# Patient Record
Sex: Male | Born: 1955
Health system: Southern US, Community
[De-identification: ages and names within clinical notes are randomized; demographics above are authoritative.]

## PROBLEM LIST (undated history)

## (undated) DIAGNOSIS — J45909 Unspecified asthma, uncomplicated: Secondary | ICD-10-CM

## (undated) DIAGNOSIS — M1990 Unspecified osteoarthritis, unspecified site: Secondary | ICD-10-CM

## (undated) DIAGNOSIS — K219 Gastro-esophageal reflux disease without esophagitis: Secondary | ICD-10-CM

## (undated) HISTORY — DX: Unspecified osteoarthritis, unspecified site: M19.90

---

## 2014-06-24 DIAGNOSIS — N529 Male erectile dysfunction, unspecified: Secondary | ICD-10-CM | POA: Insufficient documentation

## 2014-11-24 DIAGNOSIS — J4520 Mild intermittent asthma, uncomplicated: Secondary | ICD-10-CM | POA: Insufficient documentation

## 2015-01-22 HISTORY — PX: COLONOSCOPY: SHX174

## 2015-02-09 ENCOUNTER — Ambulatory Visit
Admit: 2015-02-09 | Disposition: A | Discharge status: Home / Self Care | Payer: Self-pay | Attending: Gastroenterology | Admitting: Gastroenterology

## 2015-02-09 LAB — HM COLONOSCOPY

## 2015-02-15 LAB — SURGICAL PATHOLOGY

## 2015-03-02 DIAGNOSIS — D126 Benign neoplasm of colon, unspecified: Secondary | ICD-10-CM | POA: Insufficient documentation

## 2016-03-17 ENCOUNTER — Encounter: Payer: Self-pay | Admitting: *Deleted

## 2016-03-17 ENCOUNTER — Ambulatory Visit
Admission: EM | Admit: 2016-03-17 | Discharge: 2016-03-17 | Disposition: A | Discharge status: Home / Self Care | Payer: BLUE CROSS/BLUE SHIELD | Attending: Internal Medicine | Admitting: Internal Medicine

## 2016-03-17 DIAGNOSIS — J45901 Unspecified asthma with (acute) exacerbation: Secondary | ICD-10-CM

## 2016-03-17 DIAGNOSIS — K2960 Other gastritis without bleeding: Secondary | ICD-10-CM | POA: Diagnosis not present

## 2016-03-17 HISTORY — DX: Unspecified asthma, uncomplicated: J45.909

## 2016-03-17 MED ORDER — ALBUTEROL SULFATE HFA 108 (90 BASE) MCG/ACT IN AERS: 1.0000 | INHALATION_SPRAY | Freq: Four times a day (QID) | RESPIRATORY_TRACT | Status: DC | PRN

## 2016-03-17 MED ORDER — ESOMEPRAZOLE MAGNESIUM 40 MG PO CPDR: 40.0000 mg | DELAYED_RELEASE_CAPSULE | Freq: Every day | ORAL | Status: DC

## 2016-03-17 MED ORDER — PREDNISONE 50 MG PO TABS: 50.0000 mg | ORAL_TABLET | Freq: Every day | ORAL | Status: AC

## 2016-03-17 MED ORDER — FLUTICASONE FUROATE-VILANTEROL 100-25 MCG/INH IN AEPB: 1.0000 | INHALATION_SPRAY | Freq: Every day | RESPIRATORY_TRACT | Status: DC

## 2016-03-17 NOTE — ED Provider Notes (Signed)
CSN: [id number]     Arrival date & time 03/17/16  1355 History   First MD Initiated Contact with Patient 03/17/16 1433     Chief Complaint  Patient presents with  . Cough   HPI  Patient is a 60 year old gentleman who presents today for med refills. He has a history of asthma, and is out of his chronic medicines. He also has some difficulty with reflux gastritis. He has been followed at [facility] primary care in [place], but they no longer take his insurance.  In the last month, he has had increased cough and wheezing. Once or twice a week, he is waking up at night coughing. Cough is nonproductive. He has no fever. No leg swelling. Feels fine otherwise. Saw a lung specialist in [place] a year ago, had pulmonary function testing and imaging, and breo was started, which was very helpful. He has been using over-the-counter Nexium to manage reflux symptoms. However, once daily Nexium at prescription strength, borrowed from his girlfriend, has been more helpful.   Past Medical History  Diagnosis Date  . Asthma    Past surgical history. Had 4 colon polyps removed a year ago at [facility] History reviewed. No pertinent family history. Social History  Substance Use Topics  . Smoking status: Former Research scientist (life sciences)  . Smokeless tobacco: None  . Alcohol Use: No    Review of Systems  All other systems reviewed and are negative.   Allergies  Review of patient's allergies indicates no known allergies.  Home Medications  Breo inhaler nexium otc Chondroitin nubiotic    BP 124/80 mmHg  Pulse 63  Temp(Src) 98.1 F (36.7 C) (Oral)  Resp 16  Ht 5\' 11"  (1.803 m)  Wt 240 lb (108.863 kg)  BMI 33.49 kg/m2  SpO2 99% Physical Exam  Constitutional: He is oriented to person, place, and time. No distress.  Alert, nicely groomed  HENT:  Head: Atraumatic.  Eyes:  Conjugate gaze, no eye redness/drainage  Neck: Neck supple.  Cardiovascular: Normal rate and regular rhythm.   Pulmonary/Chest: No  respiratory distress. He has no wheezes. He has no rales.  Lungs clear, but markedly diminished posteriorly, symmetric breath sounds Barrel chested, looks slightly breathless at rest, slightly splinting  Abdominal: He exhibits no distension.  Musculoskeletal: Normal range of motion. He exhibits no edema.  Neurological: He is alert and oriented to person, place, and time.  Skin: Skin is warm and dry.  No cyanosis  Nursing note and vitals reviewed.   ED Course  Procedures (including critical care time)  None today   MDM   1. Acute asthma exacerbation, unspecified asthma severity   2. Reflux gastritis    Meds ordered this encounter  Medications  . esomeprazole (NEXIUM) 40 MG capsule    Sig: Take 1 capsule (40 mg total) by mouth daily.    Dispense:  30 capsule    Refill:  0  . albuterol (PROVENTIL HFA;VENTOLIN HFA) 108 (90 Base) MCG/ACT inhaler    Sig: Inhale 1-2 puffs into the lungs every 6 (six) hours as needed for wheezing or shortness of breath.    Dispense:  1 Inhaler    Refill:  0  . predniSONE (DELTASONE) 50 MG tablet    Sig: Take 1 tablet (50 mg total) by mouth daily.    Dispense:  5 tablet    Refill:  0  . fluticasone furoate-vilanterol (BREO ELLIPTA) 100-25 MCG/INH AEPB    Sig: Inhale 1 puff into the lungs daily.  Dispense:  1 each    Refill:  0   Referred to Dr. Vicente Masson to establish care; recheck as needed    Sherlene Shams, MD 03/17/16 [phone]

## 2016-03-17 NOTE — Discharge Instructions (Signed)
Asthma, Adult Asthma is a condition of the lungs in which the airways tighten and narrow. Asthma can make it hard to breathe. Asthma cannot be cured, but medicine and lifestyle changes can help control it. Asthma may be started (triggered) by:  Animal skin flakes (dander).  Dust.  Cockroaches.  Pollen.  Mold.  Smoke.  Cleaning products.  Hair sprays or aerosol sprays.  Paint fumes or strong smells.  Cold air, weather changes, and winds.  Crying or laughing hard.  Stress.  Certain medicines or drugs.  Foods, such as dried fruit, potato chips, and sparkling grape juice.  Infections or conditions (colds, flu).  Exercise.  Certain medical conditions or diseases.  Exercise or tiring activities. HOME CARE   Take medicine as told by your doctor.  Use a peak flow meter as told by your doctor. A peak flow meter is a tool that measures how well the lungs are working.  Record and keep track of the peak flow meter's readings.  Understand and use the asthma action plan. An asthma action plan is a written plan for taking care of your asthma and treating your attacks.  To help prevent asthma attacks:  Do not smoke. Stay away from secondhand smoke.  Change your heating and air conditioning filter often.  Limit your use of fireplaces and wood stoves.  Get rid of pests (such as roaches and mice) and their droppings.  Throw away plants if you see mold on them.  Clean your floors. Dust regularly. Use cleaning products that do not smell.  Have someone vacuum when you are not home. Use a vacuum cleaner with a HEPA filter if possible.  Replace carpet with wood, tile, or vinyl flooring. Carpet can trap animal skin flakes and dust.  Use allergy-proof pillows, mattress covers, and box spring covers.  Wash bed sheets and blankets every week in hot water and dry them in a dryer.  Use blankets that are made of polyester or cotton.  Clean bathrooms and kitchens with bleach.  If possible, have someone repaint the walls in these rooms with mold-resistant paint. Keep out of the rooms that are being cleaned and painted.  Wash hands often. GET HELP IF:  You have make a whistling sound when breaking (wheeze), have shortness of breath, or have a cough even if taking medicine to prevent attacks.  The colored mucus you cough up (sputum) is thicker than usual.  The colored mucus you cough up changes from clear or white to yellow, green, gray, or bloody.  You have problems from the medicine you are taking such as:  A rash.  Itching.  Swelling.  Trouble breathing.  You need reliever medicines more than 2-3 times a week.  Your peak flow measurement is still at 50-79% of your personal best after following the action plan for 1 hour.  You have a fever. GET HELP RIGHT AWAY IF:   You seem to be worse and are not responding to medicine during an asthma attack.  You are short of breath even at rest.  You get short of breath when doing very little activity.  You have trouble eating, drinking, or talking.  You have chest pain.  You have a fast heartbeat.  Your lips or fingernails start to turn blue.  You are light-headed, dizzy, or faint.  Your peak flow is less than 50% of your personal best.   This information is not intended to replace advice given to you by your health care provider. Make sure   you discuss any questions you have with your health care provider.   Document Released: 03/27/2008 Document Revised: 06/30/2015 Document Reviewed: 05/08/2013 Elsevier Interactive Patient Education 2016 Elsevier Inc.  

## 2016-03-17 NOTE — ED Notes (Signed)
Non-productive cough and wheezing x3 months. Rx for [facility] has expired.

## 2016-03-26 ENCOUNTER — Encounter: Payer: Self-pay | Admitting: Internal Medicine

## 2016-04-03 ENCOUNTER — Ambulatory Visit (INDEPENDENT_AMBULATORY_CARE_PROVIDER_SITE_OTHER): Payer: BLUE CROSS/BLUE SHIELD | Admitting: Internal Medicine

## 2016-04-03 ENCOUNTER — Encounter: Payer: Self-pay | Admitting: Internal Medicine

## 2016-04-03 VITALS — BP 122/82 | HR 64 | Resp 16 | Ht 72.0 in | Wt 258.6 lb

## 2016-04-03 DIAGNOSIS — N529 Male erectile dysfunction, unspecified: Secondary | ICD-10-CM

## 2016-04-03 DIAGNOSIS — K219 Gastro-esophageal reflux disease without esophagitis: Secondary | ICD-10-CM | POA: Diagnosis not present

## 2016-04-03 DIAGNOSIS — J4520 Mild intermittent asthma, uncomplicated: Secondary | ICD-10-CM | POA: Diagnosis not present

## 2016-04-03 DIAGNOSIS — N528 Other male erectile dysfunction: Secondary | ICD-10-CM | POA: Diagnosis not present

## 2016-04-03 MED ORDER — FLUTICASONE FUROATE-VILANTEROL 100-25 MCG/INH IN AEPB: 1.0000 | INHALATION_SPRAY | Freq: Every day | RESPIRATORY_TRACT | Status: DC

## 2016-04-03 MED ORDER — SILDENAFIL CITRATE 20 MG PO TABS: 60.0000 mg | ORAL_TABLET | Freq: Every day | ORAL | Status: DC | PRN

## 2016-04-03 MED ORDER — ESOMEPRAZOLE MAGNESIUM 40 MG PO CPDR: 40.0000 mg | DELAYED_RELEASE_CAPSULE | Freq: Every day | ORAL | Status: DC

## 2016-04-03 NOTE — Progress Notes (Signed)
Date:  04/03/2016   Name:  Derek Grant   DOB:  Oct 14, 1956   MRN:  [id number]   Chief Complaint: Establish Care and Asthma Asthma He complains of wheezing. There is no cough, shortness of breath or sputum production. This is a chronic (onset last year - diagnosed by Pulmonologist in [place]) problem. The problem occurs intermittently. The problem has been rapidly improving. Associated symptoms include heartburn. Pertinent negatives include no fever or headaches. His past medical history is significant for asthma.  Gastroesophageal Reflux He complains of heartburn and wheezing. He reports no abdominal pain or no coughing. This is a recurrent problem. The current episode started more than 1 year ago. The problem occurs rarely (since starting nexium). Pertinent negatives include no fatigue.  Erectile Dysfunction This is a recurrent problem. The current episode started more than 1 year ago. The problem is unchanged. The nature of his difficulty is achieving erection and maintaining erection. Pertinent negatives include no chills or dysuria. Past treatments include sildenafil. The treatment provided significant relief.  He uses 20 - 60 mg as needed.  I explained to him that he will pay cash for this prescription - it can not go through a prior approval process.   Review of Systems  Constitutional: Negative for fever, chills and fatigue.  HENT: Negative for congestion.   Eyes: Negative for visual disturbance.  Respiratory: Positive for wheezing. Negative for cough, sputum production, chest tightness and shortness of breath.   Gastrointestinal: Positive for heartburn. Negative for abdominal pain, constipation and blood in stool.  Genitourinary: Negative for dysuria.  Musculoskeletal: Negative for back pain and arthralgias.  Neurological: Negative for dizziness and headaches.  Hematological: Negative for adenopathy.  Psychiatric/Behavioral: Negative for sleep disturbance and dysphoric mood.     Patient Active Problem List   Diagnosis Date Noted  . Adenomatous colon polyp 03/02/2015  . Asthma, mild intermittent 11/24/2014  . ED (erectile dysfunction) of organic origin 06/24/2014    Prior to Admission medications   Medication Sig Start Date End Date Taking? Authorizing Provider  albuterol (PROVENTIL HFA;VENTOLIN HFA) 108 (90 Base) MCG/ACT inhaler Inhale 1-2 puffs into the lungs every 6 (six) hours as needed for wheezing or shortness of breath. 03/17/16  Yes Sherlene Shams, MD  esomeprazole (NEXIUM) 40 MG capsule Take 1 capsule (40 mg total) by mouth daily. 03/17/16  Yes Sherlene Shams, MD  fluticasone furoate-vilanterol (BREO ELLIPTA) 100-25 MCG/INH AEPB Inhale 1 puff into the lungs daily. 03/17/16  Yes Sherlene Shams, MD  Glucosamine HCl (GLUCOSAMINE PO) Take by mouth.   Yes Historical Provider, MD  Probiotic Product (PROBIOTIC ADVANCED PO) Take by mouth.   Yes Historical Provider, MD  Specialty Vitamins Products (PROSTATE PO) Take by mouth.   Yes Historical Provider, MD    No Known Allergies  Past Surgical History  Procedure Laterality Date  . Colonoscopy  01/2015    adenomatous polyps    Social History  Substance Use Topics  . Smoking status: Former Smoker    Types: Cigarettes    Quit date: 06/25/1999  . Smokeless tobacco: None  . Alcohol Use: No     Medication list has been reviewed and updated.   Physical Exam  Constitutional: He is oriented to person, place, and time. He appears well-developed. No distress.  HENT:  Head: Normocephalic and atraumatic.  Neck: Normal range of motion. Neck supple. Carotid bruit is not present. No thyromegaly present.  Cardiovascular: Normal rate, regular rhythm and normal heart sounds.  Pulmonary/Chest: Effort normal and breath sounds normal. No respiratory distress. He has no wheezes. He has no rales.  Musculoskeletal: Normal range of motion.  Lymphadenopathy:    He has no cervical adenopathy.  Neurological: He is alert  and oriented to person, place, and time.  Skin: Skin is warm and dry. No rash noted.  Psychiatric: He has a normal mood and affect. His speech is normal and behavior is normal. Thought content normal.  Nursing note and vitals reviewed.   BP 122/82 mmHg  Pulse 64  Resp 16  Ht 6' (1.829 m)  Wt 258 lb 9.6 oz (117.3 kg)  BMI 35.06 kg/m2  SpO2 98%  Assessment and Plan: 1. Asthma, mild intermittent, uncomplicated - fluticasone furoate-vilanterol (BREO ELLIPTA) 100-25 MCG/INH AEPB; Inhale 1 puff into the lungs daily.  Dispense: 1 each; Refill: 12  2. Gastroesophageal reflux disease, esophagitis presence not specified - esomeprazole (NEXIUM) 40 MG capsule; Take 1 capsule (40 mg total) by mouth daily.  Dispense: 30 capsule; Refill: 12  3. ED (erectile dysfunction) of organic origin - sildenafil (REVATIO) 20 MG tablet; Take 3 tablets (60 mg total) by mouth daily as needed.  Dispense: 50 tablet; Refill: 0   Halina Maidens, MD [facility]  04/03/2016

## 2016-06-09 ENCOUNTER — Encounter: Payer: Self-pay | Admitting: Emergency Medicine

## 2016-06-09 ENCOUNTER — Emergency Department: Payer: BLUE CROSS/BLUE SHIELD

## 2016-06-09 ENCOUNTER — Ambulatory Visit (INDEPENDENT_AMBULATORY_CARE_PROVIDER_SITE_OTHER)
Admission: EM | Admit: 2016-06-09 | Discharge: 2016-06-09 | Disposition: A | Discharge status: Other Acute Inpatient Hospital | Payer: BLUE CROSS/BLUE SHIELD | Source: Home / Self Care

## 2016-06-09 ENCOUNTER — Encounter: Payer: Self-pay | Admitting: *Deleted

## 2016-06-09 ENCOUNTER — Emergency Department
Admission: EM | Admit: 2016-06-09 | Discharge: 2016-06-09 | Disposition: A | Discharge status: Home / Self Care | Payer: BLUE CROSS/BLUE SHIELD | Attending: Emergency Medicine | Admitting: Emergency Medicine

## 2016-06-09 DIAGNOSIS — S93401A Sprain of unspecified ligament of right ankle, initial encounter: Secondary | ICD-10-CM | POA: Diagnosis not present

## 2016-06-09 DIAGNOSIS — J4520 Mild intermittent asthma, uncomplicated: Secondary | ICD-10-CM | POA: Insufficient documentation

## 2016-06-09 DIAGNOSIS — Y939 Activity, unspecified: Secondary | ICD-10-CM | POA: Insufficient documentation

## 2016-06-09 DIAGNOSIS — M79646 Pain in unspecified finger(s): Secondary | ICD-10-CM

## 2016-06-09 DIAGNOSIS — W01198A Fall on same level from slipping, tripping and stumbling with subsequent striking against other object, initial encounter: Secondary | ICD-10-CM | POA: Insufficient documentation

## 2016-06-09 DIAGNOSIS — M79645 Pain in left finger(s): Secondary | ICD-10-CM | POA: Diagnosis not present

## 2016-06-09 DIAGNOSIS — Y929 Unspecified place or not applicable: Secondary | ICD-10-CM | POA: Diagnosis not present

## 2016-06-09 DIAGNOSIS — S01511A Laceration without foreign body of lip, initial encounter: Secondary | ICD-10-CM | POA: Insufficient documentation

## 2016-06-09 DIAGNOSIS — Z23 Encounter for immunization: Secondary | ICD-10-CM | POA: Diagnosis not present

## 2016-06-09 DIAGNOSIS — Z87891 Personal history of nicotine dependence: Secondary | ICD-10-CM | POA: Diagnosis not present

## 2016-06-09 DIAGNOSIS — Y999 Unspecified external cause status: Secondary | ICD-10-CM | POA: Insufficient documentation

## 2016-06-09 DIAGNOSIS — Z79899 Other long term (current) drug therapy: Secondary | ICD-10-CM | POA: Insufficient documentation

## 2016-06-09 MED ORDER — TETANUS-DIPHTH-ACELL PERTUSSIS 5-2.5-18.5 LF-MCG/0.5 IM SUSP
0.5000 mL | Freq: Once | INTRAMUSCULAR | Status: AC
  Administered 2016-06-09: 0.5 mL via INTRAMUSCULAR
  Filled 2016-06-09: qty 0.5

## 2016-06-09 MED ORDER — LIDOCAINE-EPINEPHRINE (PF) 2 %-1:200000 IJ SOLN
10.0000 mL | Freq: Once | INTRAMUSCULAR | Status: AC
  Administered 2016-06-09: 10 mL
  Filled 2016-06-09: qty 10

## 2016-06-09 MED ORDER — CEPHALEXIN 500 MG PO CAPS: 500.0000 mg | ORAL_CAPSULE | Freq: Four times a day (QID) | ORAL | 0 refills | Status: AC

## 2016-06-09 MED ORDER — LIDOCAINE-EPINEPHRINE (PF) 1 %-1:200000 IJ SOLN
INTRAMUSCULAR | Status: AC
  Filled 2016-06-09: qty 30

## 2016-06-09 NOTE — ED Notes (Signed)
Pt verbalized understanding of discharge instructions. NAD at this time. 

## 2016-06-09 NOTE — ED Provider Notes (Signed)
[facility] ____________________________________________  Time seen: Approximately 12:32 PM  I have reviewed the triage vital signs and the nursing notes.   HISTORY  Chief Complaint Laceration   HPI Derek Grant is a 60 y.o. male presents for evaluation of facial laceration. Patient reports that at 10:30 this morning he was coming out of a [employer]. Patient reports the recently repeat the sidewalk, and states that he did not realize the curb was so close and states that his right ankle rolled over the curb as he stepped on the curb edge. Patient reports rolling right ankle cough and then fall forward. Patient reports he did catch himself with his hands but his hand slid out from under him causing him then to hit his upper lip on the concrete. Patient states he does not feel like he hit any other place on his head or his face. Denies loss of consciousness, dizziness, or dazed sensation. Patient reports that he initially went to [facility] urgent care provide dilation of laceration of lip was then referred to the ER.  Patient also reports mild right ankle and left fifth finger pain from the injury. Patient denies any headache, nausea, vomiting, dizziness, vision changes, weakness, chest pain, shortness of breath, neck pain, back pain, other extremity pain or injury. Patient does not feel that he hit his head other than hitting his lip. Patient reports he does take a baby aspirin occasionally but denies any other anticoagulation. Patient reports he fell only because of rolling his ankle on the curb. Unsure of last tetanus immunization.  PCP: Army Melia   Past Medical History:  Diagnosis Date  . Asthma     Patient Active Problem List   Diagnosis Date Noted  . GERD (gastroesophageal reflux disease) 04/03/2016  . Esophageal reflux 04/03/2016  . Adenomatous colon polyp 03/02/2015  . Asthma, mild intermittent 11/24/2014  . ED (erectile dysfunction) of organic origin 06/24/2014     Past Surgical History:  Procedure Laterality Date  . COLONOSCOPY  01/2015   adenomatous polyps    Current Facility-Administered Medications:  .  lidocaine-EPINEPHrine (XYLOCAINE-EPINEPHrine) 1 %-1:200000 (PF) injection, , , ,   Current Outpatient Prescriptions:  .  albuterol (PROVENTIL HFA;VENTOLIN HFA) 108 (90 Base) MCG/ACT inhaler, Inhale 1-2 puffs into the lungs every 6 (six) hours as needed for wheezing or shortness of breath., Disp: 1 Inhaler, Rfl: 0 .  cephALEXin (KEFLEX) 500 MG capsule, Take 1 capsule (500 mg total) by mouth 4 (four) times daily., Disp: 28 capsule, Rfl: 0 .  esomeprazole (NEXIUM) 40 MG capsule, Take 1 capsule (40 mg total) by mouth daily., Disp: 30 capsule, Rfl: 12 .  fluticasone furoate-vilanterol (BREO ELLIPTA) 100-25 MCG/INH AEPB, Inhale 1 puff into the lungs daily., Disp: 1 each, Rfl: 12 .  Glucosamine HCl (GLUCOSAMINE PO), Take by mouth., Disp: , Rfl:  .  Probiotic Product (PROBIOTIC ADVANCED PO), Take by mouth., Disp: , Rfl:  .  sildenafil (REVATIO) 20 MG tablet, Take 3 tablets (60 mg total) by mouth daily as needed., Disp: 50 tablet, Rfl: 0 .  Specialty Vitamins Products (PROSTATE PO), Take by mouth., Disp: , Rfl:   Allergies Review of patient's allergies indicates no known allergies.  Family History  Problem Relation Age of Onset  . Diabetes Mother   . Alzheimer's disease Father     Social History Social History  Substance Use Topics  . Smoking status: Former Smoker    Types: Cigarettes    Quit date: 06/25/1999  . Smokeless tobacco: Never Used  .  Alcohol use No    Review of Systems Constitutional: No fever/chills Eyes: No visual changes. ENT: No sore throat. Cardiovascular: Denies chest pain. Respiratory: Denies shortness of breath. Gastrointestinal: No abdominal pain.  No nausea, no vomiting.  No diarrhea.  No constipation. Genitourinary: Negative for dysuria. Musculoskeletal: Negative for back pain. Skin: Negative for rash. As  above. Neurological: Negative for headaches, focal weakness or numbness.  10-point ROS otherwise negative.  ____________________________________________   PHYSICAL EXAM:  VITAL SIGNS: ED Triage Vitals  Enc Vitals Group     BP 06/09/16 1215 138/68     Pulse Rate 06/09/16 1215 (!) 57 Recheck 68     Resp 06/09/16 1215 18     Temp --      Temp src --      SpO2 06/09/16 1215 97 %     Weight 06/09/16 1216 240 lb (108.9 kg)     Height 06/09/16 1216 5\' 11"  (1.803 m)     Head Circumference --      Peak Flow --      Pain Score 06/09/16 1216 3     Pain Loc --      Pain Edu? --      Excl. in [place]? --     Constitutional: Alert and oriented. Well appearing and in no acute distress. Eyes: Conjunctivae are normal. PERRL. EOMI. ENT      Head: Normocephalic and atraumatic.No facial erythema, ecchymosis, tenderness. Except see skin below.      Nose: No congestion/rhinnorhea. No nasal tenderness. Nares patent.      Mouth/Throat: Mucous membranes are moist.Oropharynx non-erythematous.No dental injury noted. No dental tenderness. No oral tenderness.  Neck: No stridor. Supple without meningismus.  Hematological/Lymphatic/Immunilogical: No cervical lymphadenopathy. Cardiovascular: Normal rate, regular rhythm. Grossly normal heart sounds.  Good peripheral circulation. Respiratory: Normal respiratory effort without tachypnea nor retractions. Breath sounds are clear and equal bilaterally. No wheezes/rales/rhonchi. Chest nontender to palpation. Gastrointestinal: Soft and nontender. No distention.  Musculoskeletal:  Nontender with normal range of motion in all extremities. No midline cervical, thoracic or lumbar tenderness to palpation. Bilateral pedal pulses equal and easily palpated. Except: Right lateral malleolus mild tenderness palpation, minimal swelling, full range motion, mild pain with ankle rotation, no pain with plantar flexion or dorsiflexion, normal distal capillary refill, normal  sensation. Except: Left fifth digit proximal phalanx and MCP mild tenderness to palpation with minimal swelling, full range of motion, no pain with flexion or extension, normal this capillary refill, able to make a full fist with left hand without pain. Skin intact. Neurologic:  Normal speech and language. No gross focal neurologic deficits are appreciated. Speech is normal. No gait instability. GCS 15. Skin:  Skin is warm, dry and intact. No rash noted. Except: Upper lip just right of midline approximately 1 cm beneath the vermilion border, through and through laceration present, outer laceration 2.5 cm, inner laceration 3 cm with jagged edges. Mild active bleeding, mild lip swelling, no foreign bodies visualized. Psychiatric: Mood and affect are normal. Speech and behavior are normal. Patient exhibits appropriate insight and judgment   ___________________________________________   LABS (all labs ordered are listed, but only abnormal results are displayed)  Labs Reviewed - No data to display ____________________________________________  RADIOLOGY  Dg Ankle Complete Right  Result Date: 06/09/2016 CLINICAL DATA:  Pain following fall EXAM: RIGHT ANKLE - COMPLETE 3+ VIEW COMPARISON:  None. FINDINGS: Frontal, oblique, and lateral views were obtained. There is well corticated calcification in the medial and lateral malleolar regions, likely  residua of prior trauma. A calcification anterior to the mid talus appears well corticated and likely represents an old avulsion injury as well. No acute appearing fracture is evident. There is no joint effusion. The ankle mortise appears intact. There is mild narrowing of the medial aspect of the joint. There is a spur arising from the posterior calcaneus. IMPRESSION: No acute appearing fracture. Ankle mortise appears intact. Evidence of prior avulsion type injuries at several sites. Osteoarthritic changes noted medially. There is a small posterior calcaneal spur.  Electronically Signed   By: Lowella Grip III M.D.   On: 06/09/2016 13:18   Dg Finger Little Left  Result Date: 06/09/2016 CLINICAL DATA:  Pain following fall EXAM: LEFT FIFTH FINGER 2+V COMPARISON:  None. FINDINGS: Frontal, oblique, and lateral views were obtained. There is soft tissue swelling, primarily in the PIP joint region. There is no demonstrable fracture or dislocation. Joint spaces appear unremarkable. IMPRESSION: Soft tissue swelling. No demonstrable fracture or dislocation. No apparent arthropathy. Electronically Signed   By: Lowella Grip III M.D.   On: 06/09/2016 13:17   ____________________________________________   PROCEDURES Procedures   Procedure(s) performed:  Procedure explained and verbal consent obtained. Consent: Verbal consent obtained. Written consent not obtained. Risks and benefits: risks, benefits and alternatives were discussed Patient identity confirmed: verbally with patient and hospital-assigned identification number  Consent given by: patient   Laceration Repair Location: lip Length: external: 2.5 cm inner: 3 cm Foreign bodies: no foreign bodies Tendon involvement: none Nerve involvement: none Preparation: Patient was prepped and draped in the usual sterile fashion. Anesthesia with 1% Lidocaine with epi 7 mls Irrigation solution: saline and betadine Irrigation method: jet lavage Amount of cleaning: copious Repaired with 6-0 vicryl  Number of sutures: external: 5 inner: 6 Technique: simple interrupted  Approximation: loose Patient tolerate well. Wound well approximated post repair.  Antibiotic ointment and dressing applied.  Wound care instructions provided.  Observe for any signs of infection or other problems.    _________________________________________   INITIAL IMPRESSION / ASSESSMENT AND PLAN / ED COURSE  Pertinent labs & imaging results that were available during my care of the patient were reviewed by me and considered in my  medical decision making (see chart for details).  Very well-appearing patient. Presenting for evaluation of lip laceration post mechanical fall. Patient reports he fell when he rolled his ankle and fell forwards. Patient reports catching himself with his hands but still hit his lip on the concrete. Denies hitting his head anywhere else. No focal neurological deficits. Denies headache. Well appearing. Mild right ankle and left fifth digit pain, will evaluate x-rays. Discussed with patient, as patient with no facial tenderness, head tenderness, headache, loss consciousness or days, patient declines evaluation of CT head or face.  Per radiologist right ankle no acute changes, left fifth digit soft tissue swelling without bony abnormality. Laceration repaired. Patient tolerated well. Patient declines any need for splinting or assisted devices. Patient reports that he feels well. Counseled regarding close monitoring and follow-up. Counseled regarding eating soft foods as well as frequent mouth irrigation and rinsing. Will place patient on oral cephalexin. Tetanus immunization updated. Encourage patient to have one follow-up in 2-3 days.  Discussed follow up with Primary care physician this week. Discussed follow up and return parameters including no resolution or any worsening concerns. Patient verbalized understanding and agreed to plan.   ____________________________________________   FINAL CLINICAL IMPRESSION(S) / ED DIAGNOSES  Final diagnoses:  Finger pain  Lip laceration, initial encounter  Ankle sprain, right, initial encounter     Discharge Medication List as of 06/09/2016  1:53 PM    START taking these medications   Details  cephALEXin (KEFLEX) 500 MG capsule Take 1 capsule (500 mg total) by mouth 4 (four) times daily., Starting Fri 06/09/2016, Until Fri 06/16/2016, Print        Note: This dictation was prepared with Dragon dictation along with smaller phrase technology. Any  transcriptional errors that result from this process are unintentional.    Clinical Course      Marylene Land, NP 06/09/16 [place], MD 06/09/16 [phone]

## 2016-06-09 NOTE — Discharge Instructions (Signed)
Take medication as prescribed. Rest. Drink plenty of fluids. Apply ice. Keep clean. Rinse mouth out frequently as discussed. Eat soft foods for 2-3 days.   Wound check as discussed in 2-3 days.   Follow up with your primary care physician this week as needed. Return to ER  for new or worsening concerns.

## 2016-06-09 NOTE — ED Triage Notes (Signed)
Pt presents to ED with laceration to top lip. Pt tripped and landed on concrete. Denies LOC. Denies dizziness prior to fall.

## 2016-06-09 NOTE — ED Provider Notes (Signed)
Patient presents after catching foot on uneven concrete sidewalk at [facility].  [place] [address] forward and has large internal and external upper lip laceration.  No loss of consciousness.  Teeth feel like they are in the right place to patient.  Referred to ED for repair.  Will drive self to [facility] ED.  Charge nurse notified.     Sherlene Shams, MD 06/09/16 [phone]

## 2016-06-09 NOTE — ED Triage Notes (Signed)
Patient fell on concrete today leaving a restaurant, lacerating his upper lip. Laceration is located on the outside of the upper lip and a separate laceration is located interior of upper lip.

## 2016-06-12 ENCOUNTER — Ambulatory Visit
Admission: EM | Admit: 2016-06-12 | Discharge: 2016-06-12 | Disposition: A | Discharge status: Home / Self Care | Payer: BLUE CROSS/BLUE SHIELD | Attending: Family Medicine | Admitting: Family Medicine

## 2016-06-12 ENCOUNTER — Encounter: Payer: Self-pay | Admitting: *Deleted

## 2016-06-12 DIAGNOSIS — Z4801 Encounter for change or removal of surgical wound dressing: Secondary | ICD-10-CM

## 2016-06-12 DIAGNOSIS — Z5189 Encounter for other specified aftercare: Secondary | ICD-10-CM

## 2016-06-12 NOTE — Discharge Instructions (Signed)
Continue medication as prescribed. Rest. Drink plenty of fluids. Continue rinsing and keeping clean.   Follow up with your primary care physician this week as needed. Return to Urgent care or ER for new or worsening concerns.

## 2016-06-12 NOTE — ED Triage Notes (Signed)
Patient is here for a suture check of lip laceration that occurred 3 days ago.

## 2016-06-12 NOTE — ED Provider Notes (Signed)
[facility] ____________________________________________  Time seen: Approximately 2:55 PM  I have reviewed the triage vital signs and the nursing notes.   HISTORY  Chief Complaint Lip Laceration  HPI Derek Grant is a 60 y.o. male presenting today for wound check to left laceration that occurred 3 days ago. Patient reports 3 days ago he fell after rolling his ankle on a curb causing him to fall forward and hit his lip on the concrete. Denies head injury otherwise, denies loss of consciousness. Patient reports he was seen in the ER and had laceration repaired. Patient reports he is taking the oral cephalexin as prescribed as well as frequently rinsing his mouth with water. Patient reports the laceration seems to be healing well. Patient reports swelling in his lip has improved but still slightly swollen. States lip was painful the same day of injury, but reports no pain now. Denies any drainage, redness, pain currently, other swelling. Reports right ankle and left fifth finger pain that was present when in the ER has improved, denies current pain. Reports he had a mild headache Saturday, that resolved. Denies current headache. Denies dizziness, vision changes, neck pain, back pain or other complaints. Patient reports he is feeling well otherwise. Reports tetanus immunization updated in ER. Reports has continued to work over the weekend.     Past Medical History:  Diagnosis Date  . Asthma     Patient Active Problem List   Diagnosis Date Noted  . GERD (gastroesophageal reflux disease) 04/03/2016  . Esophageal reflux 04/03/2016  . Adenomatous colon polyp 03/02/2015  . Asthma, mild intermittent 11/24/2014  . ED (erectile dysfunction) of organic origin 06/24/2014    Past Surgical History:  Procedure Laterality Date  . COLONOSCOPY  01/2015   adenomatous polyps    No current facility-administered medications for this encounter.   Current Outpatient Prescriptions:  .   albuterol (PROVENTIL HFA;VENTOLIN HFA) 108 (90 Base) MCG/ACT inhaler, Inhale 1-2 puffs into the lungs every 6 (six) hours as needed for wheezing or shortness of breath., Disp: 1 Inhaler, Rfl: 0 .  cephALEXin (KEFLEX) 500 MG capsule, Take 1 capsule (500 mg total) by mouth 4 (four) times daily., Disp: 28 capsule, Rfl: 0 .  esomeprazole (NEXIUM) 40 MG capsule, Take 1 capsule (40 mg total) by mouth daily., Disp: 30 capsule, Rfl: 12 .  fluticasone furoate-vilanterol (BREO ELLIPTA) 100-25 MCG/INH AEPB, Inhale 1 puff into the lungs daily., Disp: 1 each, Rfl: 12 .  Glucosamine HCl (GLUCOSAMINE PO), Take by mouth., Disp: , Rfl:  .  Probiotic Product (PROBIOTIC ADVANCED PO), Take by mouth., Disp: , Rfl:  .  sildenafil (REVATIO) 20 MG tablet, Take 3 tablets (60 mg total) by mouth daily as needed., Disp: 50 tablet, Rfl: 0 .  Specialty Vitamins Products (PROSTATE PO), Take by mouth., Disp: , Rfl:   Allergies Review of patient's allergies indicates no known allergies.  Family History  Problem Relation Age of Onset  . Diabetes Mother   . Alzheimer's disease Father     Social History Social History  Substance Use Topics  . Smoking status: Former Smoker    Types: Cigarettes    Quit date: 06/25/1999  . Smokeless tobacco: Never Used  . Alcohol use No    Review of Systems Constitutional: No fever/chills Eyes: No visual changes. ENT: No sore throat. Cardiovascular: Denies chest pain. Respiratory: Denies shortness of breath. Gastrointestinal: No abdominal pain.  No nausea, no vomiting.  No diarrhea.  No constipation. Genitourinary: Negative for dysuria. Musculoskeletal: Negative  for back pain. Skin: Negative for rash. Neurological: Negative for focal weakness or numbness.  10-point ROS otherwise negative.  ____________________________________________   PHYSICAL EXAM:  VITAL SIGNS: ED Triage Vitals  Enc Vitals Group     BP 06/12/16 1354 117/79     Pulse Rate 06/12/16 1354 75     Resp  06/12/16 1354 18     Temp 06/12/16 1354 (!) 96.7 F (35.9 C)     Temp Source 06/12/16 1354 Tympanic     SpO2 06/12/16 1354 98 %     Weight --      Height --      Head Circumference --      Peak Flow --      Pain Score 06/12/16 1355 0     Pain Loc --      Pain Edu? --      Excl. in [place]? --     Constitutional: Alert and oriented. Well appearing and in no acute distress. Eyes: Conjunctivae are normal. PERRL. EOMI. ENT      Head: Normocephalic and atraumatic.Nontender.       Nose: No congestion/rhinnorhea.      Mouth/Throat: Mucous membranes are moist.Oropharynx non-erythematous.No dental tenderness.  Cardiovascular: Normal rate, regular rhythm. Grossly normal heart sounds.   Respiratory: Normal respiratory effort without tachypnea nor retractions.  Musculoskeletal: Ambulatory with steady gait. No cervical tenderness to palpation.  Neurologic:  Normal speech and language. No gross focal neurologic deficits are appreciated. Speech is normal. No gait instability.  Skin:  Skin is warm, dry and intact. No rash noted. Except: upper mid to right lip mild swelling with healing laceration, 5 outer sutures, 6 inner, sutures appear to be absorbing well, no exudate or drainage, nontender, no surrounding erythema, no fluctuance, no other swelling noted. Laceration is well approximated. Psychiatric: Mood and affect are normal. Speech and behavior are normal. Patient exhibits appropriate insight and judgment   ___________________________________________   LABS (all labs ordered are listed, but only abnormal results are displayed)  Labs Reviewed - No data to display ____________________________________________  PROCEDURES Procedures   INITIAL IMPRESSION / ASSESSMENT AND PLAN / ED COURSE  Pertinent labs & imaging results that were available during my care of the patient were reviewed by me and considered in my medical decision making (see chart for details).  Very well-appearing patient. No  acute distress. Presenting for  Wound check to lip laceration sustained 3 days ago that was repaired in the ER. Tetanus immunization was updated in ER per patient. Patient reports injury occurred from mechanical fall. Denies other complaints at this time. Laceration appears to be healing well without signs of acute bacterial infection. Laceration well approximated. Encouraged patient to continue applying ice continue taking home antibiotics continue keeping clean and rinsing frequently. Encouraged patient to continue monitoring for any pain, increased swelling or drainage or redness. Discussed strict follow-up and return parameters including any change in pain, headaches, dizziness, vision changes, weakness, drainage, fevers, new or worsening concerns.   Discussed follow up with Primary care physician this week. Discussed follow up and return parameters including no resolution or any worsening concerns. Patient verbalized understanding and agreed to plan.   ____________________________________________   FINAL CLINICAL IMPRESSION(S) / ED DIAGNOSES  Final diagnoses:  Visit for wound check     Discharge Medication List as of 06/12/2016  2:07 PM      Note: This dictation was prepared with Dragon dictation along with smaller phrase technology. Any transcriptional errors that result from this process  are unintentional.    Clinical Course        Marylene Land, NP 06/12/16 1516

## 2016-06-27 ENCOUNTER — Ambulatory Visit
Admission: EM | Admit: 2016-06-27 | Discharge: 2016-06-27 | Disposition: A | Discharge status: Home / Self Care | Payer: BLUE CROSS/BLUE SHIELD | Attending: Family Medicine | Admitting: Family Medicine

## 2016-06-27 ENCOUNTER — Ambulatory Visit (INDEPENDENT_AMBULATORY_CARE_PROVIDER_SITE_OTHER)
Admit: 2016-06-27 | Discharge: 2016-06-27 | Disposition: A | Discharge status: Home / Self Care | Payer: BLUE CROSS/BLUE SHIELD | Attending: Family Medicine | Admitting: Family Medicine

## 2016-06-27 ENCOUNTER — Encounter: Payer: Self-pay | Admitting: Emergency Medicine

## 2016-06-27 DIAGNOSIS — F0781 Postconcussional syndrome: Secondary | ICD-10-CM | POA: Diagnosis not present

## 2016-06-27 DIAGNOSIS — S060X0D Concussion without loss of consciousness, subsequent encounter: Secondary | ICD-10-CM | POA: Diagnosis not present

## 2016-06-27 NOTE — ED Triage Notes (Signed)
Patient c/o HAs that started after he fell couple of weeks ago.  Patient denies N/V.

## 2016-06-27 NOTE — ED Notes (Signed)
CT scan scheduled for today at [facility] at 2:30pm

## 2016-06-27 NOTE — ED Notes (Signed)
CT Scan prior authorization # [id number]

## 2016-08-08 NOTE — ED Provider Notes (Signed)
[facility]    CSN: [id number] Arrival date & time: 06/27/16  1208     History   Chief Complaint Chief Complaint  Patient presents with  . Headache    HPI Derek Grant is a 60 y.o. male.   The history is provided by the patient.  Headache  Pain location:  Generalized Quality:  Dull Radiates to:  Does not radiate Severity currently:  4/10 Duration:  3 weeks Timing:  Intermittent Progression:  Waxing and waning Chronicity:  New Similar to prior headaches: yes   Context comment:  Fall several weeks ago hitting his head; denies loss of consciousness Relieved by:  Acetaminophen Associated symptoms: fatigue   Associated symptoms: no abdominal pain, no back pain, no blurred vision, no congestion, no cough, no diarrhea, no dizziness, no drainage, no ear pain, no eye pain, no facial pain, no fever, no focal weakness, no hearing loss, no loss of balance, no myalgias, no nausea, no near-syncope, no neck pain, no neck stiffness, no numbness, no paresthesias, no photophobia, no seizures, no sinus pressure, no swollen glands, no syncope, no tingling, no visual change, no vomiting and no weakness   Risk factors: no family hx of SAH     Past Medical History:  Diagnosis Date  . Asthma     Patient Active Problem List   Diagnosis Date Noted  . GERD (gastroesophageal reflux disease) 04/03/2016  . Esophageal reflux 04/03/2016  . Adenomatous colon polyp 03/02/2015  . Asthma, mild intermittent 11/24/2014  . ED (erectile dysfunction) of organic origin 06/24/2014    Past Surgical History:  Procedure Laterality Date  . COLONOSCOPY  01/2015   adenomatous polyps       Home Medications    Prior to Admission medications   Medication Sig Start Date End Date Taking? Authorizing Provider  albuterol (PROVENTIL HFA;VENTOLIN HFA) 108 (90 Base) MCG/ACT inhaler Inhale 1-2 puffs into the lungs every 6 (six) hours as needed for wheezing or shortness of breath. 03/17/16   Sherlene Shams, MD  esomeprazole (NEXIUM) 40 MG capsule Take 1 capsule (40 mg total) by mouth daily. 04/03/16   Glean Hess, MD  fluticasone furoate-vilanterol (BREO ELLIPTA) 100-25 MCG/INH AEPB Inhale 1 puff into the lungs daily. 04/03/16   Glean Hess, MD  Glucosamine HCl (GLUCOSAMINE PO) Take by mouth.    Historical Provider, MD  Probiotic Product (PROBIOTIC ADVANCED PO) Take by mouth.    Historical Provider, MD  sildenafil (REVATIO) 20 MG tablet Take 3 tablets (60 mg total) by mouth daily as needed. 04/03/16   Glean Hess, MD  Specialty Vitamins Products (PROSTATE PO) Take by mouth.    Historical Provider, MD    Family History Family History  Problem Relation Age of Onset  . Diabetes Mother   . Alzheimer's disease Father     Social History Social History  Substance Use Topics  . Smoking status: Former Smoker    Types: Cigarettes    Quit date: 06/25/1999  . Smokeless tobacco: Never Used  . Alcohol use No     Allergies   Review of patient's allergies indicates no known allergies.   Review of Systems Review of Systems  Constitutional: Positive for fatigue. Negative for fever.  HENT: Negative for congestion, ear pain, hearing loss, postnasal drip and sinus pressure.   Eyes: Negative for blurred vision, photophobia and pain.  Respiratory: Negative for cough.   Cardiovascular: Negative for syncope and near-syncope.  Gastrointestinal: Negative for abdominal pain, diarrhea, nausea and  vomiting.  Musculoskeletal: Negative for back pain, myalgias, neck pain and neck stiffness.  Neurological: Positive for headaches. Negative for dizziness, focal weakness, seizures, weakness, numbness, paresthesias and loss of balance.     Physical Exam Triage Vital Signs ED Triage Vitals  Enc Vitals Group     BP 06/27/16 1236 118/79     Pulse Rate 06/27/16 1236 64     Resp 06/27/16 1236 16     Temp 06/27/16 1236 98.2 F (36.8 C)     Temp Source 06/27/16 1236 Oral     SpO2 06/27/16  1236 98 %     Weight 06/27/16 1235 240 lb (108.9 kg)     Height 06/27/16 1235 5\' 11"  (1.803 m)     Head Circumference --      Peak Flow --      Pain Score 06/27/16 1235 2     Pain Loc --      Pain Edu? --      Excl. in [place]? --    No data found.   Updated Vital Signs BP 118/79 (BP Location: Left Arm)   Pulse 64   Temp 98.2 F (36.8 C) (Oral)   Resp 16   Ht 5\' 11"  (1.803 m)   Wt 240 lb (108.9 kg)   SpO2 98%   BMI 33.47 kg/m   Visual Acuity Right Eye Distance:   Left Eye Distance:   Bilateral Distance:    Right Eye Near:   Left Eye Near:    Bilateral Near:     Physical Exam  Constitutional: He is oriented to person, place, and time. He appears well-developed and well-nourished. No distress.  HENT:  Head: Normocephalic and atraumatic.  Right Ear: Tympanic membrane, external ear and ear canal normal.  Left Ear: Tympanic membrane, external ear and ear canal normal.  Nose: Nose normal.  Mouth/Throat: Uvula is midline, oropharynx is clear and moist and mucous membranes are normal. No oropharyngeal exudate or tonsillar abscesses.  Eyes: Conjunctivae and EOM are normal. Pupils are equal, round, and reactive to light. Right eye exhibits no discharge. Left eye exhibits no discharge. No scleral icterus.  Neck: Normal range of motion. Neck supple. No tracheal deviation present. No thyromegaly present.  Cardiovascular: Normal rate, regular rhythm and normal heart sounds.   Pulmonary/Chest: Effort normal and breath sounds normal. No stridor. No respiratory distress. He has no wheezes. He has no rales. He exhibits no tenderness.  Lymphadenopathy:    He has no cervical adenopathy.  Neurological: He is alert and oriented to person, place, and time. He has normal reflexes. He displays normal reflexes. No cranial nerve deficit. He exhibits normal muscle tone. Coordination normal.  Skin: Skin is warm and dry. No rash noted. He is not diaphoretic.  Nursing note and vitals  reviewed.    UC Treatments / Results  Labs (all labs ordered are listed, but only abnormal results are displayed) Labs Reviewed - No data to display  EKG  EKG Interpretation None       Radiology No results found.  Procedures Procedures (including critical care time)  Medications Ordered in UC Medications - No data to display   Initial Impression / Assessment and Plan / UC Course  I have reviewed the triage vital signs and the nursing notes.  Pertinent labs & imaging results that were available during my care of the patient were reviewed by me and considered in my medical decision making (see chart for details).  Clinical Course  Final Clinical Impressions(s) / UC Diagnoses   Final diagnoses:  Concussion, without loss of consciousness, subsequent encounter  Post concussion syndrome    New Prescriptions Discharge Medication List as of 06/27/2016  3:01 PM     1. diagnosis reviewed with patient 2. Recommend supportive treatment with otc analgesics, rest, monitor 4. Follow-up prn if symptoms worsen or don't improve   Norval Gable, MD 08/08/16 [phone]

## 2016-08-09 ENCOUNTER — Ambulatory Visit (INDEPENDENT_AMBULATORY_CARE_PROVIDER_SITE_OTHER): Payer: BLUE CROSS/BLUE SHIELD | Admitting: Internal Medicine

## 2016-08-09 ENCOUNTER — Encounter: Payer: Self-pay | Admitting: Internal Medicine

## 2016-08-09 VITALS — BP 117/78 | HR 63 | Resp 16 | Ht 71.0 in | Wt 243.0 lb

## 2016-08-09 DIAGNOSIS — Z0000 Encounter for general adult medical examination without abnormal findings: Secondary | ICD-10-CM

## 2016-08-09 DIAGNOSIS — J4520 Mild intermittent asthma, uncomplicated: Secondary | ICD-10-CM

## 2016-08-09 DIAGNOSIS — Z125 Encounter for screening for malignant neoplasm of prostate: Secondary | ICD-10-CM | POA: Diagnosis not present

## 2016-08-09 DIAGNOSIS — K219 Gastro-esophageal reflux disease without esophagitis: Secondary | ICD-10-CM | POA: Diagnosis not present

## 2016-08-09 DIAGNOSIS — Z23 Encounter for immunization: Secondary | ICD-10-CM | POA: Diagnosis not present

## 2016-08-09 DIAGNOSIS — H9193 Unspecified hearing loss, bilateral: Secondary | ICD-10-CM

## 2016-08-09 LAB — POCT URINALYSIS DIPSTICK
Bilirubin, UA: NEGATIVE
GLUCOSE UA: NEGATIVE
Ketones, UA: NEGATIVE
Leukocytes, UA: NEGATIVE
Nitrite, UA: NEGATIVE
PROTEIN UA: NEGATIVE
Spec Grav, UA: 1.01
UROBILINOGEN UA: 0.2
pH, UA: 5

## 2016-08-09 NOTE — Progress Notes (Signed)
Date:  08/09/2016   Name:  Derek Grant   DOB:  02/04/1956   MRN:  [id number]   Chief Complaint: Annual Exam (Had a fall few months ago and had to get stiches. ) Derek Grant is a 60 y.o. male who presents today for his Complete Annual Exam. He feels well. He reports exercising daily. He reports he is sleeping well. He recently got a Tetanus booster.  Gastroesophageal Reflux  He complains of heartburn. He reports no abdominal pain, no chest pain, no choking or no wheezing. This is a chronic problem. The current episode started more than 1 year ago. The problem occurs rarely. Pertinent negatives include no fatigue. He has tried a PPI for the symptoms.  Asthma  There is no shortness of breath or wheezing. This is a chronic problem. The current episode started more than 1 year ago. The problem occurs rarely. The problem has been unchanged. Associated symptoms include heartburn. Pertinent negatives include no appetite change, chest pain, headaches, myalgias or trouble swallowing. His past medical history is significant for asthma.      Review of Systems  Constitutional: Negative for appetite change, chills, diaphoresis, fatigue and unexpected weight change.  HENT: Positive for hearing loss. Negative for tinnitus, trouble swallowing and voice change.   Eyes: Negative for visual disturbance.  Respiratory: Negative for choking, shortness of breath and wheezing.   Cardiovascular: Negative for chest pain, palpitations and leg swelling.  Gastrointestinal: Positive for heartburn. Negative for abdominal pain, blood in stool, constipation and diarrhea.  Genitourinary: Negative for difficulty urinating, dysuria and frequency.  Musculoskeletal: Negative for arthralgias, back pain and myalgias.  Skin: Negative for color change and rash.  Neurological: Negative for dizziness, syncope and headaches.  Hematological: Negative for adenopathy.  Psychiatric/Behavioral: Negative for dysphoric mood  and sleep disturbance.    Patient Active Problem List   Diagnosis Date Noted  . GERD (gastroesophageal reflux disease) 04/03/2016  . Esophageal reflux 04/03/2016  . Adenomatous colon polyp 03/02/2015  . Asthma, mild intermittent 11/24/2014  . ED (erectile dysfunction) of organic origin 06/24/2014    Prior to Admission medications   Medication Sig Start Date End Date Taking? Authorizing Provider  albuterol (PROVENTIL HFA;VENTOLIN HFA) 108 (90 Base) MCG/ACT inhaler Inhale 1-2 puffs into the lungs every 6 (six) hours as needed for wheezing or shortness of breath. 03/17/16  Yes Sherlene Shams, MD  esomeprazole (NEXIUM) 40 MG capsule Take 1 capsule (40 mg total) by mouth daily. 04/03/16  Yes Glean Hess, MD  fluticasone furoate-vilanterol (BREO ELLIPTA) 100-25 MCG/INH AEPB Inhale 1 puff into the lungs daily. 04/03/16  Yes Glean Hess, MD  Glucosamine HCl (GLUCOSAMINE PO) Take by mouth.   Yes Historical Provider, MD  Probiotic Product (PROBIOTIC ADVANCED PO) Take by mouth.   Yes Historical Provider, MD  sildenafil (REVATIO) 20 MG tablet Take 3 tablets (60 mg total) by mouth daily as needed. 04/03/16  Yes Glean Hess, MD  Specialty Vitamins Products (PROSTATE PO) Take by mouth.   Yes Historical Provider, MD    No Known Allergies  Past Surgical History:  Procedure Laterality Date  . COLONOSCOPY  01/2015   adenomatous polyps    Social History  Substance Use Topics  . Smoking status: Former Smoker    Types: Cigarettes    Quit date: 06/25/1999  . Smokeless tobacco: Never Used  . Alcohol use No     Medication list has been reviewed and updated.   Physical Exam  Constitutional:  He is oriented to person, place, and time. He appears well-developed and well-nourished.  HENT:  Head: Normocephalic.  Right Ear: Tympanic membrane, external ear and ear canal normal. Decreased hearing is noted.  Left Ear: Tympanic membrane, external ear and ear canal normal. Decreased hearing is  noted.  Nose: Nose normal.  Mouth/Throat: Uvula is midline and oropharynx is clear and moist. No posterior oropharyngeal erythema.  Eyes: Conjunctivae and EOM are normal. Pupils are equal, round, and reactive to light.  Neck: Normal range of motion. Neck supple. Carotid bruit is not present. No thyromegaly present.  Cardiovascular: Normal rate, regular rhythm, normal heart sounds and intact distal pulses.   Pulmonary/Chest: Effort normal and breath sounds normal. He has no wheezes. Right breast exhibits no mass. Left breast exhibits no mass.  Abdominal: Soft. Normal appearance and bowel sounds are normal. There is no hepatosplenomegaly. There is no tenderness.  Musculoskeletal: Normal range of motion.  Lymphadenopathy:    He has no cervical adenopathy.  Neurological: He is alert and oriented to person, place, and time. He has normal reflexes.  Skin: Skin is warm, dry and intact.  Psychiatric: He has a normal mood and affect. His speech is normal and behavior is normal. Judgment and thought content normal.  Nursing note and vitals reviewed.   BP 117/78   Pulse 63   Resp 16   Ht 5\' 11"  (1.803 m)   Wt 243 lb (110.2 kg)   SpO2 98%   BMI 33.89 kg/m   Assessment and Plan: 1. Annual physical exam Normal exam except for weight and hearing Trace blood on dipstick - negative micro - Comprehensive metabolic panel - Lipid panel - POCT urinalysis dipstick  2. Gastroesophageal reflux disease without esophagitis Controlled on PPI - CBC with Differential/Platelet  3. Mild intermittent asthma without complication Stable on Breo  4. Decreased hearing of both ears Recommend hearing protection  5. Prostate cancer screening DRE deferred to lack of sx - PSA  6. Need for influenza vaccination - Flu Vaccine QUAD 36+ mos IM   Halina Maidens, MD [facility]  08/09/2016

## 2016-08-10 LAB — CBC WITH DIFFERENTIAL/PLATELET
Basophils Absolute: 0 10*3/uL (ref 0.0–0.2)
Basos: 0 %
EOS (ABSOLUTE): 0.2 10*3/uL (ref 0.0–0.4)
EOS: 6 %
HEMATOCRIT: 41.8 % (ref 37.5–51.0)
HEMOGLOBIN: 14.1 g/dL (ref 12.6–17.7)
IMMATURE GRANS (ABS): 0 10*3/uL (ref 0.0–0.1)
IMMATURE GRANULOCYTES: 0 %
LYMPHS: 37 %
Lymphocytes Absolute: 1.4 10*3/uL (ref 0.7–3.1)
MCH: 28.8 pg (ref 26.6–33.0)
MCHC: 33.7 g/dL (ref 31.5–35.7)
MCV: 85 fL (ref 79–97)
MONOCYTES: 8 %
Monocytes Absolute: 0.3 10*3/uL (ref 0.1–0.9)
NEUTROS PCT: 49 %
Neutrophils Absolute: 1.9 10*3/uL (ref 1.4–7.0)
Platelets: 238 10*3/uL (ref 150–379)
RBC: 4.9 x10E6/uL (ref 4.14–5.80)
RDW: 14.7 % (ref 12.3–15.4)
WBC: 3.9 10*3/uL (ref 3.4–10.8)

## 2016-08-10 LAB — LIPID PANEL
CHOL/HDL RATIO: 2.9 ratio (ref 0.0–5.0)
Cholesterol, Total: 140 mg/dL (ref 100–199)
HDL: 49 mg/dL (ref >39)
LDL CALC: 79 mg/dL (ref 0–99)
Triglycerides: 59 mg/dL (ref 0–149)
VLDL Cholesterol Cal: 12 mg/dL (ref 5–40)

## 2016-08-10 LAB — COMPREHENSIVE METABOLIC PANEL
ALT: 32 IU/L (ref 0–44)
AST: 28 IU/L (ref 0–40)
Albumin/Globulin Ratio: 1.6 (ref 1.2–2.2)
Albumin: 4.1 g/dL (ref 3.6–4.8)
Alkaline Phosphatase: 51 IU/L (ref 39–117)
BUN/Creatinine Ratio: 10 (ref 10–24)
BUN: 13 mg/dL (ref 8–27)
Bilirubin Total: 0.4 mg/dL (ref 0.0–1.2)
CALCIUM: 9.2 mg/dL (ref 8.6–10.2)
CO2: 28 mmol/L (ref 18–29)
CREATININE: 1.28 mg/dL — AB (ref 0.76–1.27)
Chloride: 99 mmol/L (ref 96–106)
GFR calc Af Amer: 69 mL/min/{1.73_m2} (ref >59)
GFR, EST NON AFRICAN AMERICAN: 60 mL/min/{1.73_m2} (ref >59)
GLOBULIN, TOTAL: 2.6 g/dL (ref 1.5–4.5)
Glucose: 100 mg/dL — ABNORMAL HIGH (ref 65–99)
Potassium: 4.6 mmol/L (ref 3.5–5.2)
SODIUM: 138 mmol/L (ref 134–144)
Total Protein: 6.7 g/dL (ref 6.0–8.5)

## 2016-08-10 LAB — PSA: Prostate Specific Ag, Serum: 2.6 ng/mL (ref 0.0–4.0)

## 2017-02-05 ENCOUNTER — Ambulatory Visit (INDEPENDENT_AMBULATORY_CARE_PROVIDER_SITE_OTHER): Payer: BLUE CROSS/BLUE SHIELD

## 2017-02-05 ENCOUNTER — Ambulatory Visit
Admission: EM | Admit: 2017-02-05 | Discharge: 2017-02-05 | Disposition: A | Discharge status: Home / Self Care | Payer: BLUE CROSS/BLUE SHIELD | Attending: Family Medicine | Admitting: Family Medicine

## 2017-02-05 ENCOUNTER — Encounter: Payer: Self-pay | Admitting: *Deleted

## 2017-02-05 DIAGNOSIS — M79645 Pain in left finger(s): Secondary | ICD-10-CM

## 2017-02-05 DIAGNOSIS — M79644 Pain in right finger(s): Secondary | ICD-10-CM

## 2017-02-05 NOTE — ED Provider Notes (Signed)
[facility]    CSN: [id number] Arrival date & time: 02/05/17  1046     History   Chief Complaint Chief Complaint  Patient presents with  . Hand Problem    HPI Derek Grant is a 61 y.o. male.   61 yo male with a c/o "painful knot" to right thumb and mild left 5th finger pain since fall on August 2017. Patient states right thumb knot has slowly developed since then. Left 5th finger x-ray at the time was negative for fracture. Patient also complains of "chafing" to mid upper lip when drinking hot fluids (coffee) over the last 3 months. Denies any fevers, chills, drainage.    The history is provided by the patient.    Past Medical History:  Diagnosis Date  . Asthma     Patient Active Problem List   Diagnosis Date Noted  . Decreased hearing of both ears 08/09/2016  . GERD (gastroesophageal reflux disease) 04/03/2016  . Adenomatous colon polyp 03/02/2015  . Asthma, mild intermittent 11/24/2014  . ED (erectile dysfunction) of organic origin 06/24/2014    Past Surgical History:  Procedure Laterality Date  . COLONOSCOPY  01/2015   adenomatous polyps       Home Medications    Prior to Admission medications   Medication Sig Start Date End Date Taking? Authorizing Provider  albuterol (PROVENTIL HFA;VENTOLIN HFA) 108 (90 Base) MCG/ACT inhaler Inhale 1-2 puffs into the lungs every 6 (six) hours as needed for wheezing or shortness of breath. 03/17/16  Yes Sherlene Shams, MD  esomeprazole (NEXIUM) 40 MG capsule Take 1 capsule (40 mg total) by mouth daily. 04/03/16  Yes Glean Hess, MD  fluticasone furoate-vilanterol (BREO ELLIPTA) 100-25 MCG/INH AEPB Inhale 1 puff into the lungs daily. 04/03/16  Yes Glean Hess, MD  Glucosamine HCl (GLUCOSAMINE PO) Take by mouth.   Yes Historical Provider, MD  Probiotic Product (PROBIOTIC ADVANCED PO) Take by mouth.    Historical Provider, MD  sildenafil (REVATIO) 20 MG tablet Take 3 tablets (60 mg total) by mouth daily  as needed. 04/03/16   Glean Hess, MD  Specialty Vitamins Products (PROSTATE PO) Take by mouth.    Historical Provider, MD    Family History Family History  Problem Relation Age of Onset  . Diabetes Mother   . Alzheimer's disease Father   . Throat cancer Brother   . Prostate cancer Paternal Uncle     Social History Social History  Substance Use Topics  . Smoking status: Former Smoker    Types: Cigarettes    Quit date: 06/25/1999  . Smokeless tobacco: Never Used  . Alcohol use No     Allergies   Patient has no known allergies.   Review of Systems Review of Systems   Physical Exam Triage Vital Signs ED Triage Vitals  Enc Vitals Group     BP 02/05/17 1223 129/86     Pulse Rate 02/05/17 1223 (!) 54     Resp 02/05/17 1223 16     Temp 02/05/17 1223 98.4 F (36.9 C)     Temp Source 02/05/17 1223 Oral     SpO2 02/05/17 1223 98 %     Weight 02/05/17 1225 246 lb (111.6 kg)     Height 02/05/17 1225 5\' 11"  (1.803 m)     Head Circumference --      Peak Flow --      Pain Score --      Pain Loc --  Pain Edu? --      Excl. in [place]? --    No data found.   Updated Vital Signs BP 129/86 (BP Location: Left Arm)   Pulse (!) 54   Temp 98.4 F (36.9 C) (Oral)   Resp 16   Ht 5\' 11"  (1.803 m)   Wt 246 lb (111.6 kg)   SpO2 98%   BMI 34.31 kg/m   Visual Acuity Right Eye Distance:   Left Eye Distance:   Bilateral Distance:    Right Eye Near:   Left Eye Near:    Bilateral Near:     Physical Exam  Constitutional: He appears well-developed and well-nourished. No distress.  HENT:  Mouth/Throat: Oropharynx is clear and moist. No oral lesions.  Well healed lip laceration; minimal, faint, barely visible scarring noted.   Musculoskeletal:       Right hand: He exhibits tenderness and deformity (minimal approx 70mm solid subcutaneous mass noted over IP joint of right thumb; no skin breakdown; no drainage; no redness; mild/minimal tenderrnes  to palpation; normal range  of motion and strength). He exhibits normal range of motion, no bony tenderness, normal two-point discrimination, normal capillary refill, no laceration and no swelling. Normal sensation noted. Normal strength noted.       Left hand: He exhibits tenderness (over the right 5th finger PIP joint; no edema, erythema, or gross deformity noted; normal range of motion and strength). He exhibits normal range of motion, normal two-point discrimination, no deformity, no laceration and no swelling. Normal sensation noted. Normal strength noted.       Hands: Skin: He is not diaphoretic.  Nursing note and vitals reviewed.    UC Treatments / Results  Labs (all labs ordered are listed, but only abnormal results are displayed) Labs Reviewed - No data to display  EKG  EKG Interpretation None       Radiology Dg Finger Thumb Right  Result Date: 02/05/2017 CLINICAL DATA:  Status post fall in July 2017 with right thumb injury. The patient has noticed a palpable knot over the IP joint region which has developed over the past 3 months. It is tender to touch. EXAM: RIGHT THUMB 2+V COMPARISON:  None in PACs FINDINGS: The bones are subjectively adequately mineralized. A small spur arises from the radial aspect of the base of the distal phalanx. There small spurs that arise from the articular margins of the distal portion of the proximal phalanx. It sesamoid bone is present. No soft tissue mass is observed. IMPRESSION: Small marginal osteophytes are likely the cause of the patient's palpable finding at the IP joint. No definite soft tissue lesions are observed. Electronically Signed   By: David  Martinique M.D.   On: 02/05/2017 13:17    Procedures Procedures (including critical care time)  Medications Ordered in UC Medications - No data to display   Initial Impression / Assessment and Plan / UC Course  I have reviewed the triage vital signs and the nursing notes.  Pertinent labs & imaging results that were  available during my care of the patient were reviewed by me and considered in my medical decision making (see chart for details).       Final Clinical Impressions(s) / UC Diagnoses   Final diagnoses:  Thumb pain, right  Pain in finger of left hand    New Prescriptions Discharge Medication List as of 02/05/2017  1:14 PM     1. x-ray results and diagnosis reviewed with patient 2. Recommend supportive treatment with otc  anti-inflammatories/analgesics prn 3. Follow-up prn if symptoms worsen or don't improve   Norval Gable, MD 02/05/17 2004

## 2017-02-05 NOTE — ED Triage Notes (Signed)
Pt fell last July with injuries to right thumb, left 5th finger, and lip. Seen here for that incident. Returns today c/o painful knot at site of injury to right thumb and 5th finger. Also c/o changes to lip area that was sutured closed.

## 2017-04-20 ENCOUNTER — Other Ambulatory Visit: Payer: Self-pay | Admitting: Internal Medicine

## 2017-04-20 DIAGNOSIS — K219 Gastro-esophageal reflux disease without esophagitis: Secondary | ICD-10-CM

## 2017-04-21 ENCOUNTER — Other Ambulatory Visit: Payer: Self-pay | Admitting: Internal Medicine

## 2017-04-21 DIAGNOSIS — J4520 Mild intermittent asthma, uncomplicated: Secondary | ICD-10-CM

## 2017-08-20 ENCOUNTER — Ambulatory Visit (INDEPENDENT_AMBULATORY_CARE_PROVIDER_SITE_OTHER): Payer: BLUE CROSS/BLUE SHIELD | Admitting: Internal Medicine

## 2017-08-20 ENCOUNTER — Encounter: Payer: Self-pay | Admitting: Internal Medicine

## 2017-08-20 VITALS — BP 122/74 | HR 54 | Ht 71.0 in | Wt 259.0 lb

## 2017-08-20 DIAGNOSIS — Z23 Encounter for immunization: Secondary | ICD-10-CM

## 2017-08-20 DIAGNOSIS — K219 Gastro-esophageal reflux disease without esophagitis: Secondary | ICD-10-CM | POA: Diagnosis not present

## 2017-08-20 DIAGNOSIS — Z125 Encounter for screening for malignant neoplasm of prostate: Secondary | ICD-10-CM | POA: Diagnosis not present

## 2017-08-20 DIAGNOSIS — Z0000 Encounter for general adult medical examination without abnormal findings: Secondary | ICD-10-CM | POA: Diagnosis not present

## 2017-08-20 DIAGNOSIS — J4520 Mild intermittent asthma, uncomplicated: Secondary | ICD-10-CM

## 2017-08-20 LAB — POCT URINALYSIS DIPSTICK
Bilirubin, UA: NEGATIVE
GLUCOSE UA: NEGATIVE
Ketones, UA: NEGATIVE
Leukocytes, UA: NEGATIVE
NITRITE UA: NEGATIVE
PH UA: 6 (ref 5.0–8.0)
PROTEIN UA: NEGATIVE
RBC UA: NEGATIVE
SPEC GRAV UA: 1.01 (ref 1.010–1.025)
Urobilinogen, UA: 0.2 E.U./dL

## 2017-08-20 NOTE — Progress Notes (Signed)
Date:  08/20/2017   Name:  Derek Grant   DOB:  Apr 08, 1956   MRN:  [id number]   Chief Complaint: Annual Exam (Flu Shot) and Gastroesophageal Reflux (States nexium not working the same. ) Derek Grant is a 61 y.o. male who presents today for his Complete Annual Exam. He feels well. 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 He reports he is sleeping well.   Gastroesophageal Reflux  He complains of heartburn. He reports no abdominal pain, no chest pain, no choking or no wheezing. This is a recurrent problem. The problem has been gradually improving. The symptoms are aggravated by certain foods and lying down. Pertinent negatives include no fatigue. He has tried a PPI for the symptoms. The treatment provided significant relief.  Asthma  There is no shortness of breath or wheezing. This is a chronic problem. The problem occurs rarely. Associated symptoms include heartburn. Pertinent negatives include no appetite change, chest pain, headaches, myalgias or trouble swallowing. His symptoms are alleviated by steroid inhaler and beta-agonist. His past medical history is significant for asthma.      Review of Systems  Constitutional: Negative for appetite change, chills, diaphoresis, fatigue and unexpected weight change.  HENT: Negative for hearing loss, tinnitus, trouble swallowing and voice change.   Eyes: Negative for visual disturbance.  Respiratory: Negative for choking, shortness of breath and wheezing.   Cardiovascular: Negative for chest pain, palpitations and leg swelling.  Gastrointestinal: Positive for heartburn. Negative for abdominal pain, blood in stool, constipation and diarrhea.  Genitourinary: Negative for difficulty urinating, dysuria and frequency.  Musculoskeletal: Negative for arthralgias, back pain and myalgias.  Skin: Negative for color change and rash.  Neurological: Negative for dizziness, syncope and headaches.  Hematological: Negative for  adenopathy.  Psychiatric/Behavioral: Negative for dysphoric mood and sleep disturbance.    Patient Active Problem List   Diagnosis Date Noted  . Decreased hearing of both ears 08/09/2016  . GERD (gastroesophageal reflux disease) 04/03/2016  . Adenomatous colon polyp 03/02/2015  . Asthma, mild intermittent 11/24/2014  . ED (erectile dysfunction) of organic origin 06/24/2014    Prior to Admission medications   Medication Sig Start Date End Date Taking? Authorizing Provider  albuterol (PROVENTIL HFA;VENTOLIN HFA) 108 (90 Base) MCG/ACT inhaler Inhale 1-2 puffs into the lungs every 6 (six) hours as needed for wheezing or shortness of breath. 03/17/16  Yes Sherlene Shams, MD  BREO ELLIPTA 100-25 MCG/INH AEPB INHALE 1 PUFF INTO THE LUNGS DAILY 04/23/17  Yes Glean Hess, MD  esomeprazole (NEXIUM) 40 MG capsule TAKE 1 CAPSULE BY MOUTH DAILY 04/20/17  Yes Glean Hess, MD  Glucosamine HCl (GLUCOSAMINE PO) Take by mouth.   Yes [provider]  Probiotic Product (PROBIOTIC ADVANCED PO) Take by mouth.   Yes [provider]  sildenafil (REVATIO) 20 MG tablet Take 3 tablets (60 mg total) by mouth daily as needed. 04/03/16  Yes Glean Hess, MD  Specialty Vitamins Products (PROSTATE PO) Take by mouth.   Yes [provider]    No Known Allergies  Past Surgical History:  Procedure Laterality Date  . COLONOSCOPY  01/2015   adenomatous polyps    Social History  Substance Use Topics  . Smoking status: Former Smoker    Types: Cigarettes    Quit date: 06/25/1999  . Smokeless tobacco: Never Used  . Alcohol use No     Medication list has been reviewed and updated.  PHQ 2/9 Scores 08/20/2017 08/09/2016 04/03/2016  PHQ - 2 Score 0 0 0    Physical Exam  Constitutional: He is oriented to person, place, and time. He appears well-developed and well-nourished.  HENT:  Head: Normocephalic.  Right Ear: Tympanic membrane, external ear and ear canal normal.  Left  Ear: Tympanic membrane, external ear and ear canal normal.  Nose: Nose normal.  Mouth/Throat: Uvula is midline and oropharynx is clear and moist.  Eyes: Pupils are equal, round, and reactive to light. Conjunctivae and EOM are normal.  Neck: Normal range of motion. Neck supple. Carotid bruit is not present. No thyromegaly present.  Cardiovascular: Normal rate, regular rhythm, normal heart sounds and intact distal pulses.   Pulmonary/Chest: Effort normal and breath sounds normal. He has no wheezes. Right breast exhibits no mass. Left breast exhibits no mass.  Abdominal: Soft. Normal appearance and bowel sounds are normal. There is no hepatosplenomegaly. There is no tenderness.  Musculoskeletal: Normal range of motion.  Lymphadenopathy:    He has no cervical adenopathy.  Neurological: He is alert and oriented to person, place, and time. He has normal reflexes.  Skin: Skin is warm, dry and intact.  Psychiatric: He has a normal mood and affect. His speech is normal and behavior is normal. Judgment and thought content normal.  Nursing note and vitals reviewed.   BP 122/74   Pulse (!) 54   Ht 5\' 11"  (1.803 m)   Wt 259 lb (117.5 kg)   SpO2 97%   BMI 36.12 kg/m   Assessment and Plan: 1. Annual physical exam Discussed diet changes - Comprehensive metabolic panel - Lipid panel - POCT urinalysis dipstick  2. Gastroesophageal reflux disease without esophagitis Continue nexium (Dr. Reddy's mfg preferred by pt) - CBC with Differential/Platelet  3. Mild intermittent asthma without complication controlled - Pneumococcal polysaccharide vaccine 23-valent greater than or equal to 2yo subcutaneous/IM  4. Prostate cancer screening - PSA  5. Need for influenza vaccination - Flu Vaccine QUAD 36+ mos IM  6. Need for pneumococcal vaccination - Pneumococcal polysaccharide vaccine 23-valent greater than or equal to 2yo subcutaneous/IM   No orders of the defined types were placed in this  encounter.   Partially dictated using Editor, commissioning. Any errors are unintentional.  Halina Maidens, MD [facility]  08/20/2017

## 2017-08-20 NOTE — Patient Instructions (Addendum)
Schedule an Eye Exam with [facility]  Pneumococcal Polysaccharide Vaccine: What You Need to Know 1. Why get vaccinated? Vaccination can protect older adults (and some children and younger adults) from pneumococcal disease. Pneumococcal disease is caused by bacteria that can spread from person to person through close contact. It can cause ear infections, and it can also lead to more serious infections of the:  Lungs (pneumonia),  Blood (bacteremia), and  Covering of the brain and spinal cord (meningitis). Meningitis can cause deafness and brain damage, and it can be fatal.  Anyone can get pneumococcal disease, but children under 72 years of age, people with certain medical conditions, adults over 48 years of age, and cigarette smokers are at the highest risk. About 18,000 older adults die each year from pneumococcal disease in the [place]. Treatment of pneumococcal infections with penicillin and other drugs used to be more effective. But some strains of the disease have become resistant to these drugs. This makes prevention of the disease, through vaccination, even more important. 2. Pneumococcal polysaccharide vaccine (PPSV23) Pneumococcal polysaccharide vaccine (PPSV23) protects against 23 types of pneumococcal bacteria. It will not prevent all pneumococcal disease. PPSV23 is recommended for:  All adults 15 years of age and older,  Anyone 2 through 64 years of age with certain long-term health problems,  Anyone 2 through 63 years of age with a weakened immune system,  Adults 28 through 63 years of age who smoke cigarettes or have asthma.  Most people need only one dose of PPSV. A second dose is recommended for certain high-risk groups. People 87 and older should get a dose even if they have gotten one or more doses of the vaccine before they turned 65. Your healthcare provider can give you more information about these recommendations. Most healthy adults develop  protection within 2 to 3 weeks of getting the shot. 3. Some people should not get this vaccine  Anyone who has had a life-threatening allergic reaction to PPSV should not get another dose.  Anyone who has a severe allergy to any component of PPSV should not receive it. Tell your provider if you have any severe allergies.  Anyone who is moderately or severely ill when the shot is scheduled may be asked to wait until they recover before getting the vaccine. Someone with a mild illness can usually be vaccinated.  Children less than 88 years of age should not receive this vaccine.  There is no evidence that PPSV is harmful to either a pregnant woman or to her fetus. However, as a precaution, women who need the vaccine should be vaccinated before becoming pregnant, if possible. 4. Risks of a vaccine reaction With any medicine, including vaccines, there is a chance of side effects. These are usually mild and go away on their own, but serious reactions are also possible. About half of people who get PPSV have mild side effects, such as redness or pain where the shot is given, which go away within about two days. Less than 1 out of 100 people develop a fever, muscle aches, or more severe local reactions. Problems that could happen after any vaccine:  People sometimes faint after a medical procedure, including vaccination. Sitting or lying down for about 15 minutes can help prevent fainting, and injuries caused by a fall. Tell your doctor if you feel dizzy, or have vision changes or ringing in the ears.  Some people get severe pain in the shoulder and have difficulty moving the arm where a  shot was given. This happens very rarely.  Any medication can cause a severe allergic reaction. Such reactions from a vaccine are very rare, estimated at about 1 in a million doses, and would happen within a few minutes to a few hours after the vaccination. As with any medicine, there is a very remote chance of a  vaccine causing a serious injury or death. The safety of vaccines is always being monitored. For more information, visit: http://www.aguilar.org/ 5. What if there is a serious reaction? What should I look for? Look for anything that concerns you, such as signs of a severe allergic reaction, very high fever, or unusual behavior. Signs of a severe allergic reaction can include hives, swelling of the face and throat, difficulty breathing, a fast heartbeat, dizziness, and weakness. These would usually start a few minutes to a few hours after the vaccination. What should I do? If you think it is a severe allergic reaction or other emergency that can't wait, call 9-1-1 or get to the nearest hospital. Otherwise, call your doctor. Afterward, the reaction should be reported to the Vaccine Adverse Event Reporting System (VAERS). Your doctor might file this report, or you can do it yourself through the VAERS web site at www.vaers.SamedayNews.es, or by calling (581)579-0290. VAERS does not give medical advice. 6. How can I learn more?  Ask your doctor. He or she can give you the vaccine package insert or suggest other sources of information.  Call your local or state health department.  Contact the Centers for Disease Control and Prevention (CDC): ? Call 367-809-1991 (1-800-CDC-INFO) or ? Visit CDC's website at http://hunter.com/ CDC Pneumococcal Polysaccharide Vaccine VIS (02/13/14) This information is not intended to replace advice given to you by your health care provider. Make sure you discuss any questions you have with your health care provider. Document Released: 08/06/2006 Document Revised: 06/29/2016 Document Reviewed: 06/29/2016 Elsevier Interactive Patient Education  2017 [employer].

## 2017-08-21 LAB — COMPREHENSIVE METABOLIC PANEL
ALK PHOS: 49 IU/L (ref 39–117)
ALT: 27 IU/L (ref 0–44)
AST: 21 IU/L (ref 0–40)
Albumin/Globulin Ratio: 1.4 (ref 1.2–2.2)
Albumin: 3.8 g/dL (ref 3.6–4.8)
BUN/Creatinine Ratio: 9 — ABNORMAL LOW (ref 10–24)
BUN: 11 mg/dL (ref 8–27)
Bilirubin Total: 0.3 mg/dL (ref 0.0–1.2)
CO2: 23 mmol/L (ref 20–29)
Calcium: 9 mg/dL (ref 8.6–10.2)
Chloride: 103 mmol/L (ref 96–106)
Creatinine, Ser: 1.24 mg/dL (ref 0.76–1.27)
GFR calc Af Amer: 71 mL/min/{1.73_m2} (ref >59)
GFR calc non Af Amer: 61 mL/min/{1.73_m2} (ref >59)
GLUCOSE: 84 mg/dL (ref 65–99)
Globulin, Total: 2.7 g/dL (ref 1.5–4.5)
Potassium: 4.6 mmol/L (ref 3.5–5.2)
Sodium: 141 mmol/L (ref 134–144)
Total Protein: 6.5 g/dL (ref 6.0–8.5)

## 2017-08-21 LAB — CBC WITH DIFFERENTIAL/PLATELET
BASOS ABS: 0 10*3/uL (ref 0.0–0.2)
BASOS: 1 %
EOS (ABSOLUTE): 0.3 10*3/uL (ref 0.0–0.4)
EOS: 9 %
Hematocrit: 43.3 % (ref 37.5–51.0)
Hemoglobin: 14.4 g/dL (ref 13.0–17.7)
Immature Grans (Abs): 0 10*3/uL (ref 0.0–0.1)
Immature Granulocytes: 0 %
LYMPHS ABS: 1.4 10*3/uL (ref 0.7–3.1)
Lymphs: 37 %
MCH: 28.6 pg (ref 26.6–33.0)
MCHC: 33.3 g/dL (ref 31.5–35.7)
MCV: 86 fL (ref 79–97)
MONOS ABS: 0.3 10*3/uL (ref 0.1–0.9)
Monocytes: 7 %
NEUTROS ABS: 1.8 10*3/uL (ref 1.4–7.0)
NEUTROS PCT: 46 %
PLATELETS: 246 10*3/uL (ref 150–379)
RBC: 5.03 x10E6/uL (ref 4.14–5.80)
RDW: 14.8 % (ref 12.3–15.4)
WBC: 3.8 10*3/uL (ref 3.4–10.8)

## 2017-08-21 LAB — PSA: PROSTATE SPECIFIC AG, SERUM: 2.6 ng/mL (ref 0.0–4.0)

## 2017-08-21 LAB — LIPID PANEL
CHOLESTEROL TOTAL: 149 mg/dL (ref 100–199)
Chol/HDL Ratio: 2.6 ratio (ref 0.0–5.0)
HDL: 58 mg/dL (ref >39)
LDL CALC: 82 mg/dL (ref 0–99)
TRIGLYCERIDES: 45 mg/dL (ref 0–149)
VLDL Cholesterol Cal: 9 mg/dL (ref 5–40)

## 2017-11-04 ENCOUNTER — Other Ambulatory Visit: Payer: Self-pay | Admitting: Internal Medicine

## 2017-11-04 DIAGNOSIS — K219 Gastro-esophageal reflux disease without esophagitis: Secondary | ICD-10-CM

## 2017-11-04 DIAGNOSIS — J4520 Mild intermittent asthma, uncomplicated: Secondary | ICD-10-CM

## 2017-11-27 ENCOUNTER — Encounter: Payer: BLUE CROSS/BLUE SHIELD | Admitting: Internal Medicine

## 2018-02-17 IMAGING — CR DG FINGER THUMB 2+V*R*
3 series · 3 of 3 positions shown · non-contrast
Comparison: None in PACs

CLINICAL DATA: Status post fall in April 2016 with right thumb
injury. The patient has noticed a palpable knot over the IP joint
region which has developed over the past 3 months. It is tender to
touch.

EXAM:
RIGHT THUMB 2+V

[finger ap]
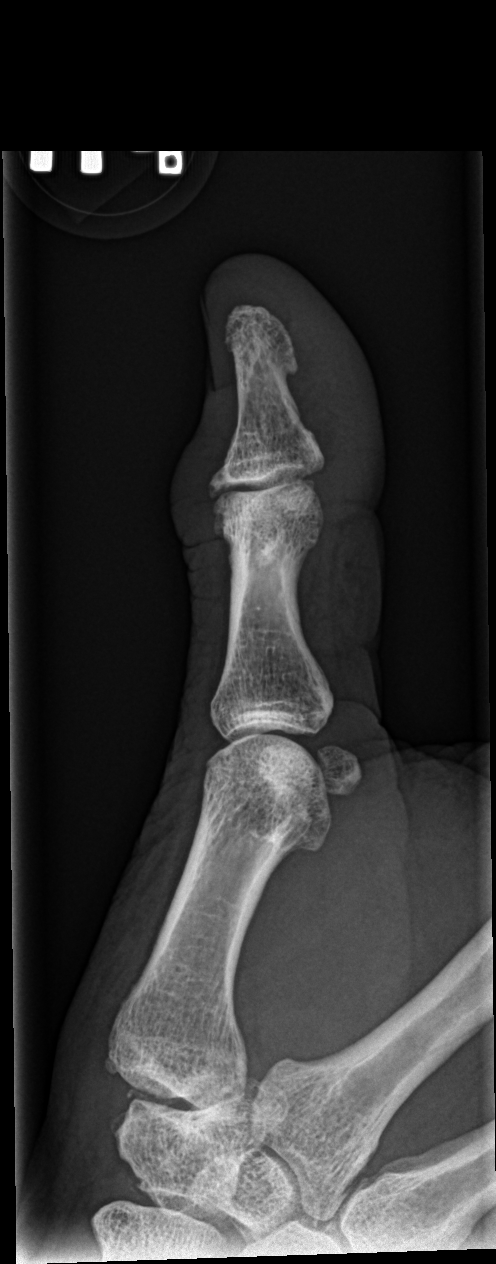

[finger obl]
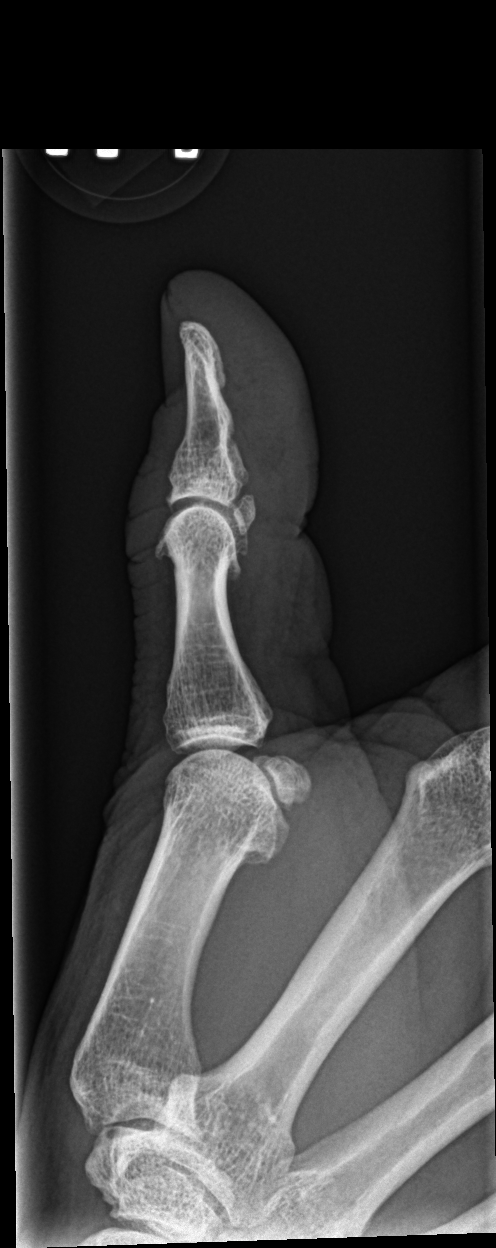

[finger lat]
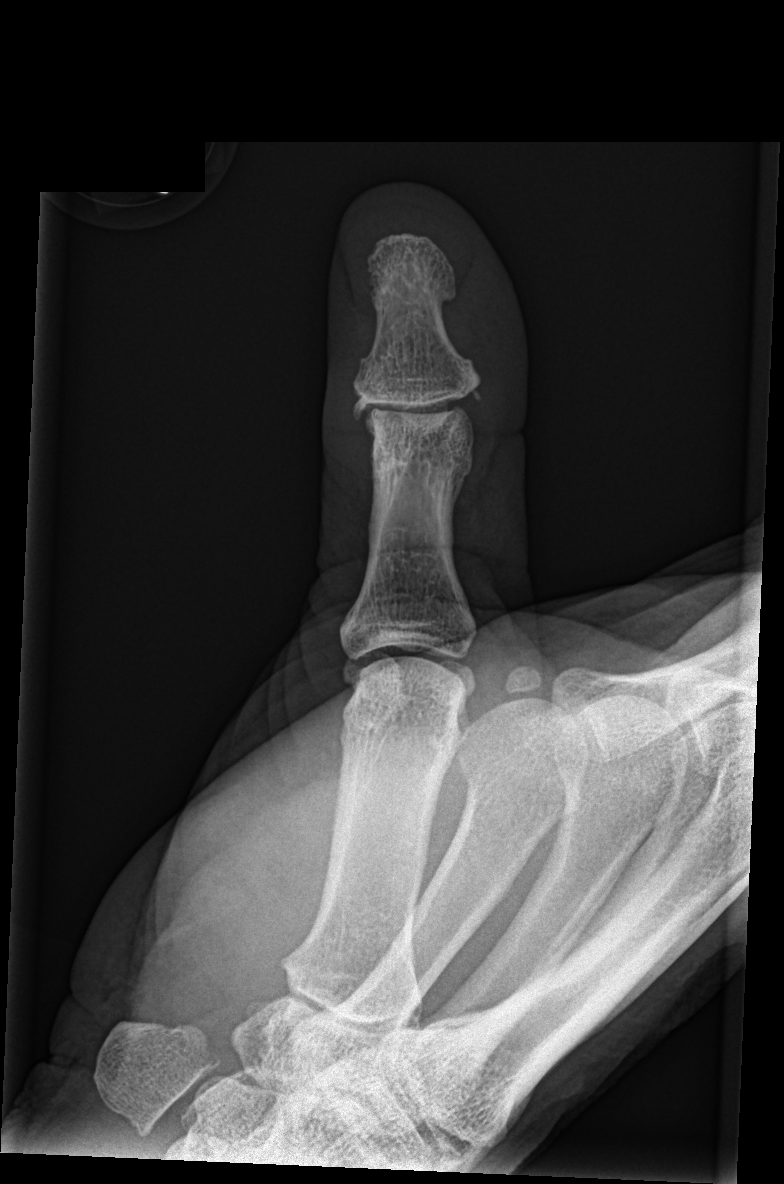

[3 of 3 positions shown; findings below may reference images not displayed]

FINDINGS: The bones are subjectively adequately mineralized. A small spur
arises from the radial aspect of the base of the distal phalanx.
There small spurs that arise from the articular margins of the
distal portion of the proximal phalanx. It sesamoid bone is present.
No soft tissue mass is observed.
IMPRESSION: Small marginal osteophytes are likely the cause of the patient's
palpable finding at the IP joint. No definite soft tissue lesions
are observed.

## 2018-08-21 ENCOUNTER — Encounter: Payer: BLUE CROSS/BLUE SHIELD | Admitting: Internal Medicine

## 2018-11-22 ENCOUNTER — Other Ambulatory Visit: Payer: Self-pay | Admitting: Internal Medicine

## 2018-11-22 DIAGNOSIS — J4520 Mild intermittent asthma, uncomplicated: Secondary | ICD-10-CM

## 2018-11-22 DIAGNOSIS — K219 Gastro-esophageal reflux disease without esophagitis: Secondary | ICD-10-CM

## 2019-02-06 ENCOUNTER — Encounter: Payer: Self-pay | Admitting: Internal Medicine

## 2019-02-06 NOTE — Progress Notes (Deleted)
Date:  02/06/2019   Name:  Derek Grant   DOB:  06-16-1956   MRN:  [id number]   Chief Complaint: No chief complaint on file. Derek Grant is a 63 y.o. male who presents today for his Complete Annual Exam. He feels {DESC; WELL/FAIRLY WELL/POORLY:18703}. He reports exercising ***. He reports he is sleeping {DESC; WELL/FAIRLY WELL/POORLY:18703}.   HPI  Review of Systems  Constitutional: Negative for appetite change, chills, diaphoresis, fatigue and unexpected weight change.  HENT: Negative for hearing loss, tinnitus, trouble swallowing and voice change.   Eyes: Negative for visual disturbance.  Respiratory: Negative for choking, shortness of breath and wheezing.   Cardiovascular: Negative for chest pain, palpitations and leg swelling.  Gastrointestinal: Negative for abdominal pain, blood in stool, constipation and diarrhea.  Genitourinary: Negative for difficulty urinating, dysuria and frequency.  Musculoskeletal: Negative for arthralgias, back pain and myalgias.  Skin: Negative for color change and rash.  Neurological: Negative for dizziness, syncope and headaches.  Hematological: Negative for adenopathy.  Psychiatric/Behavioral: Negative for dysphoric mood and sleep disturbance.    Patient Active Problem List   Diagnosis Date Noted  . Decreased hearing of both ears 08/09/2016  . GERD (gastroesophageal reflux disease) 04/03/2016  . Adenomatous colon polyp 03/02/2015  . Asthma, mild intermittent 11/24/2014  . ED (erectile dysfunction) of organic origin 06/24/2014    No Known Allergies  Past Surgical History:  Procedure Laterality Date  . COLONOSCOPY  01/2015   adenomatous polyps    Social History   Tobacco Use  . Smoking status: Former Smoker    Types: Cigarettes    Last attempt to quit: 06/25/1999    Years since quitting: 19.6  . Smokeless tobacco: Never Used  Substance Use Topics  . Alcohol use: No    Alcohol/week: 0.0 standard drinks  . Drug use: No      Medication list has been reviewed and updated.  No outpatient medications have been marked as taking for the 02/06/19 encounter (Appointment) with Glean Hess, MD.    [facility] 2/9 Scores 08/20/2017 08/09/2016 04/03/2016  PHQ - 2 Score 0 0 0    BP Readings from Last 3 Encounters:  08/20/17 122/74  02/05/17 129/86  08/09/16 117/78    Physical Exam Vitals signs and nursing note reviewed.  Constitutional:      Appearance: Normal appearance. He is well-developed.  HENT:     Head: Normocephalic.     Right Ear: Tympanic membrane, ear canal and external ear normal.     Left Ear: Tympanic membrane, ear canal and external ear normal.     Nose: Nose normal.     Mouth/Throat:     Pharynx: Uvula midline.  Eyes:     Conjunctiva/sclera: Conjunctivae normal.     Pupils: Pupils are equal, round, and reactive to light.  Neck:     Musculoskeletal: Normal range of motion and neck supple.     Thyroid: No thyromegaly.     Vascular: No carotid bruit.  Cardiovascular:     Rate and Rhythm: Normal rate and regular rhythm.     Heart sounds: Normal heart sounds.  Pulmonary:     Effort: Pulmonary effort is normal.     Breath sounds: Normal breath sounds. No wheezing.  Chest:     Breasts:        Right: No mass.        Left: No mass.  Abdominal:     General: Bowel sounds are normal.  Palpations: Abdomen is soft.     Tenderness: There is no abdominal tenderness.  Musculoskeletal: Normal range of motion.  Lymphadenopathy:     Cervical: No cervical adenopathy.  Skin:    General: Skin is warm and dry.  Neurological:     Mental Status: He is alert and oriented to person, place, and time.     Deep Tendon Reflexes: Reflexes are normal and symmetric.  Psychiatric:        Speech: Speech normal.        Behavior: Behavior normal.        Thought Content: Thought content normal.        Judgment: Judgment normal.     Wt Readings from Last 3 Encounters:  08/20/17 259 lb (117.5 kg)  02/05/17  246 lb (111.6 kg)  08/09/16 243 lb (110.2 kg)    There were no vitals taken for this visit.  Assessment and Plan:

## 2019-02-15 ENCOUNTER — Other Ambulatory Visit: Payer: Self-pay | Admitting: Internal Medicine

## 2019-02-15 DIAGNOSIS — J4520 Mild intermittent asthma, uncomplicated: Secondary | ICD-10-CM

## 2019-02-15 DIAGNOSIS — K219 Gastro-esophageal reflux disease without esophagitis: Secondary | ICD-10-CM

## 2019-06-02 ENCOUNTER — Other Ambulatory Visit: Payer: Self-pay

## 2019-06-02 ENCOUNTER — Ambulatory Visit (INDEPENDENT_AMBULATORY_CARE_PROVIDER_SITE_OTHER): Payer: No Typology Code available for payment source | Admitting: Internal Medicine

## 2019-06-02 ENCOUNTER — Encounter: Payer: Self-pay | Admitting: Internal Medicine

## 2019-06-02 VITALS — BP 118/68 | HR 72 | Ht 71.0 in | Wt 255.0 lb

## 2019-06-02 DIAGNOSIS — N529 Male erectile dysfunction, unspecified: Secondary | ICD-10-CM

## 2019-06-02 DIAGNOSIS — Z125 Encounter for screening for malignant neoplasm of prostate: Secondary | ICD-10-CM | POA: Diagnosis not present

## 2019-06-02 DIAGNOSIS — Z0000 Encounter for general adult medical examination without abnormal findings: Secondary | ICD-10-CM | POA: Diagnosis not present

## 2019-06-02 DIAGNOSIS — K219 Gastro-esophageal reflux disease without esophagitis: Secondary | ICD-10-CM | POA: Diagnosis not present

## 2019-06-02 DIAGNOSIS — J4520 Mild intermittent asthma, uncomplicated: Secondary | ICD-10-CM

## 2019-06-02 LAB — POCT URINALYSIS DIPSTICK
Bilirubin, UA: NEGATIVE
Blood, UA: NEGATIVE
Glucose, UA: NEGATIVE
Ketones, UA: NEGATIVE
Leukocytes, UA: NEGATIVE
Nitrite, UA: NEGATIVE
Protein, UA: NEGATIVE
Spec Grav, UA: 1.015 (ref 1.010–1.025)
Urobilinogen, UA: 0.2 E.U./dL
pH, UA: 7.5 (ref 5.0–8.0)

## 2019-06-02 MED ORDER — BREO ELLIPTA 100-25 MCG/INH IN AEPB: 1.0000 | INHALATION_SPRAY | Freq: Every day | RESPIRATORY_TRACT | 5 refills | Status: DC

## 2019-06-02 MED ORDER — ESOMEPRAZOLE MAGNESIUM 40 MG PO CPDR: 40.0000 mg | DELAYED_RELEASE_CAPSULE | Freq: Every day | ORAL | 5 refills | Status: DC

## 2019-06-02 MED ORDER — SILDENAFIL CITRATE 20 MG PO TABS: 60.0000 mg | ORAL_TABLET | Freq: Every day | ORAL | 0 refills | Status: DC | PRN

## 2019-06-02 NOTE — Progress Notes (Signed)
Date:  06/02/2019   Name:  Derek Grant   DOB:  August 02, 1956   MRN:  [id number]   Chief Complaint: Annual Exam Derek Grant is a 63 y.o. male who presents today for his Complete Annual Exam. He feels well. He reports exercising gardening, haymaking, etc. He reports he is sleeping well. He had been dieting and lost about 10 lbs but then gained some of it back.  Colonoscopy  01/2015 PPV-23 07/2017  Asthma There is no cough, shortness of breath or wheezing. This is a chronic problem. The problem occurs intermittently. Associated symptoms include heartburn. Pertinent negatives include no appetite change, chest pain, headaches, myalgias or trouble swallowing. His symptoms are alleviated by steroid inhaler and beta-agonist. He reports significant improvement on treatment. His past medical history is significant for asthma.  Gastroesophageal Reflux He complains of heartburn. He reports no abdominal pain, no chest pain, no choking, no coughing or no wheezing. This is a recurrent problem. The problem occurs rarely. Pertinent negatives include no fatigue. He has tried a PPI for the symptoms. The treatment provided significant relief.    Review of Systems  Constitutional: Negative for appetite change, chills, diaphoresis, fatigue and unexpected weight change.  HENT: Positive for hearing loss. Negative for tinnitus, trouble swallowing and voice change.   Eyes: Negative for visual disturbance.  Respiratory: Negative for cough, choking, shortness of breath and wheezing.   Cardiovascular: Negative for chest pain, palpitations and leg swelling.  Gastrointestinal: Positive for heartburn. Negative for abdominal pain, blood in stool, constipation and diarrhea.  Genitourinary: Negative for difficulty urinating, dysuria, frequency and urgency.       Nocturia x 2  Musculoskeletal: Negative for arthralgias, back pain and myalgias.  Skin: Negative for color change and rash.  Allergic/Immunologic:  Negative for environmental allergies.  Neurological: Negative for dizziness, syncope and headaches.  Hematological: Negative for adenopathy.  Psychiatric/Behavioral: Negative for dysphoric mood and sleep disturbance.    Patient Active Problem List   Diagnosis Date Noted  . Decreased hearing of both ears 08/09/2016  . GERD (gastroesophageal reflux disease) 04/03/2016  . Adenomatous colon polyp 03/02/2015  . Asthma, mild intermittent 11/24/2014  . ED (erectile dysfunction) of organic origin 06/24/2014    No Known Allergies  Past Surgical History:  Procedure Laterality Date  . COLONOSCOPY  01/2015   adenomatous polyps    Social History   Tobacco Use  . Smoking status: Former Smoker    Types: Cigarettes    Quit date: 06/25/1999    Years since quitting: 19.9  . Smokeless tobacco: Never Used  Substance Use Topics  . Alcohol use: No    Alcohol/week: 0.0 standard drinks  . Drug use: No     Medication list has been reviewed and updated.  Current Meds  Medication Sig  . BREO ELLIPTA 100-25 MCG/INH AEPB INHALE 1 PUFF BY MOUTH LUNGS DAILY  . esomeprazole (NEXIUM) 40 MG capsule TAKE 1 CAPSULE BY MOUTH ONCE DAILY  . Glucosamine HCl (GLUCOSAMINE PO) Take by mouth.  . Probiotic Product (PROBIOTIC ADVANCED PO) Take by mouth.  . sildenafil (REVATIO) 20 MG tablet Take 3 tablets (60 mg total) by mouth daily as needed.  Marland Kitchen Specialty Vitamins Products (PROSTATE PO) Take by mouth.    PHQ 2/9 Scores 06/02/2019 08/20/2017 08/09/2016 04/03/2016  PHQ - 2 Score 0 0 0 0    BP Readings from Last 3 Encounters:  06/02/19 118/68  08/20/17 122/74  02/05/17 129/86    Physical Exam Vitals signs and  nursing note reviewed.  Constitutional:      Appearance: Normal appearance. He is well-developed.  HENT:     Head: Normocephalic.     Right Ear: Tympanic membrane, ear canal and external ear normal.     Left Ear: Tympanic membrane, ear canal and external ear normal.     Nose: Nose normal.  Eyes:      Conjunctiva/sclera: Conjunctivae normal.     Pupils: Pupils are equal, round, and reactive to light.  Neck:     Musculoskeletal: Normal range of motion and neck supple.     Thyroid: No thyromegaly.     Vascular: No carotid bruit.  Cardiovascular:     Rate and Rhythm: Normal rate and regular rhythm.     Heart sounds: Normal heart sounds.  Pulmonary:     Effort: Pulmonary effort is normal.     Breath sounds: Normal breath sounds. No wheezing.  Chest:     Breasts:        Right: No mass.        Left: No mass.  Abdominal:     General: Bowel sounds are normal.     Palpations: Abdomen is soft.     Tenderness: There is no abdominal tenderness.  Genitourinary:    Comments: Deferred Musculoskeletal: Normal range of motion.     Right lower leg: No edema.     Left lower leg: No edema.  Lymphadenopathy:     Cervical: No cervical adenopathy.  Skin:    General: Skin is warm and dry.     Capillary Refill: Capillary refill takes less than 2 seconds.  Neurological:     General: No focal deficit present.     Mental Status: He is alert and oriented to person, place, and time.     Deep Tendon Reflexes: Reflexes are normal and symmetric.  Psychiatric:        Attention and Perception: Attention normal.        Mood and Affect: Mood normal.        Speech: Speech normal.        Behavior: Behavior normal.        Thought Content: Thought content normal.        Cognition and Memory: Cognition normal.        Judgment: Judgment normal.     Wt Readings from Last 3 Encounters:  06/02/19 255 lb (115.7 kg)  08/20/17 259 lb (117.5 kg)  02/05/17 246 lb (111.6 kg)    BP 118/68   Pulse 72   Ht 5\' 11"  (1.803 m)   Wt 255 lb (115.7 kg)   SpO2 97%   BMI 35.57 kg/m   Assessment and Plan: 1. Annual physical exam Normal exam except for weight Work on diet  - Comprehensive metabolic panel - Lipid panel - POCT urinalysis dipstick  2. Prostate cancer screening DRE deferred due to minimal sx  - PSA  3. Mild intermittent asthma without complication controlled - fluticasone furoate-vilanterol (BREO ELLIPTA) 100-25 MCG/INH AEPB; Inhale 1 puff into the lungs daily at 2 PM.  Dispense: 180 each; Refill: 5 - CBC with Differential/Platelet  4. Gastroesophageal reflux disease, esophagitis presence not specified Controlled with daily PPI - esomeprazole (NEXIUM) 40 MG capsule; Take 1 capsule (40 mg total) by mouth daily.  Dispense: 30 capsule; Refill: 5  5. ED (erectile dysfunction) of organic origin - sildenafil (REVATIO) 20 MG tablet; Take 3 tablets (60 mg total) by mouth daily as needed.  Dispense: 50 tablet; Refill: 0  Partially dictated using Editor, commissioning. Any errors are unintentional.  Halina Maidens, MD [facility]  06/02/2019

## 2019-06-02 NOTE — Patient Instructions (Signed)

## 2019-06-03 LAB — CBC WITH DIFFERENTIAL/PLATELET
Basophils Absolute: 0 10*3/uL (ref 0.0–0.2)
Basos: 1 %
EOS (ABSOLUTE): 0.3 10*3/uL (ref 0.0–0.4)
Eos: 6 %
Hematocrit: 42.2 % (ref 37.5–51.0)
Hemoglobin: 14.4 g/dL (ref 13.0–17.7)
Immature Grans (Abs): 0 10*3/uL (ref 0.0–0.1)
Immature Granulocytes: 0 %
Lymphocytes Absolute: 1.7 10*3/uL (ref 0.7–3.1)
Lymphs: 38 %
MCH: 29.1 pg (ref 26.6–33.0)
MCHC: 34.1 g/dL (ref 31.5–35.7)
MCV: 85 fL (ref 79–97)
Monocytes Absolute: 0.3 10*3/uL (ref 0.1–0.9)
Monocytes: 7 %
Neutrophils Absolute: 2.2 10*3/uL (ref 1.4–7.0)
Neutrophils: 48 %
Platelets: 227 10*3/uL (ref 150–450)
RBC: 4.94 x10E6/uL (ref 4.14–5.80)
RDW: 13 % (ref 11.6–15.4)
WBC: 4.5 10*3/uL (ref 3.4–10.8)

## 2019-06-03 LAB — COMPREHENSIVE METABOLIC PANEL
ALT: 21 IU/L (ref 0–44)
AST: 20 IU/L (ref 0–40)
Albumin/Globulin Ratio: 1.8 (ref 1.2–2.2)
Albumin: 4.2 g/dL (ref 3.8–4.8)
Alkaline Phosphatase: 51 IU/L (ref 39–117)
BUN/Creatinine Ratio: 10 (ref 10–24)
BUN: 13 mg/dL (ref 8–27)
Bilirubin Total: 0.2 mg/dL (ref 0.0–1.2)
CO2: 24 mmol/L (ref 20–29)
Calcium: 9.2 mg/dL (ref 8.6–10.2)
Chloride: 102 mmol/L (ref 96–106)
Creatinine, Ser: 1.26 mg/dL (ref 0.76–1.27)
GFR calc Af Amer: 69 mL/min/{1.73_m2} (ref >59)
GFR calc non Af Amer: 60 mL/min/{1.73_m2} (ref >59)
Globulin, Total: 2.4 g/dL (ref 1.5–4.5)
Glucose: 87 mg/dL (ref 65–99)
Potassium: 4.8 mmol/L (ref 3.5–5.2)
Sodium: 138 mmol/L (ref 134–144)
Total Protein: 6.6 g/dL (ref 6.0–8.5)

## 2019-06-03 LAB — PSA: Prostate Specific Ag, Serum: 3 ng/mL (ref 0.0–4.0)

## 2019-06-03 LAB — LIPID PANEL
Chol/HDL Ratio: 2.9 ratio (ref 0.0–5.0)
Cholesterol, Total: 140 mg/dL (ref 100–199)
HDL: 48 mg/dL (ref >39)
LDL Calculated: 81 mg/dL (ref 0–99)
Triglycerides: 57 mg/dL (ref 0–149)
VLDL Cholesterol Cal: 11 mg/dL (ref 5–40)

## 2019-11-04 DIAGNOSIS — M1711 Unilateral primary osteoarthritis, right knee: Secondary | ICD-10-CM | POA: Diagnosis not present

## 2019-11-04 DIAGNOSIS — M222X9 Patellofemoral disorders, unspecified knee: Secondary | ICD-10-CM | POA: Insufficient documentation

## 2019-11-04 DIAGNOSIS — M222X1 Patellofemoral disorders, right knee: Secondary | ICD-10-CM | POA: Diagnosis not present

## 2019-11-04 DIAGNOSIS — M179 Osteoarthritis of knee, unspecified: Secondary | ICD-10-CM | POA: Insufficient documentation

## 2019-11-04 DIAGNOSIS — M1710 Unilateral primary osteoarthritis, unspecified knee: Secondary | ICD-10-CM | POA: Insufficient documentation

## 2019-11-05 DIAGNOSIS — M25561 Pain in right knee: Secondary | ICD-10-CM | POA: Diagnosis not present

## 2019-11-05 DIAGNOSIS — M25661 Stiffness of right knee, not elsewhere classified: Secondary | ICD-10-CM | POA: Diagnosis not present

## 2019-11-21 ENCOUNTER — Other Ambulatory Visit: Payer: Self-pay | Admitting: Internal Medicine

## 2019-11-21 DIAGNOSIS — K219 Gastro-esophageal reflux disease without esophagitis: Secondary | ICD-10-CM

## 2019-12-05 ENCOUNTER — Ambulatory Visit: Payer: Medicare HMO | Attending: Internal Medicine

## 2019-12-05 DIAGNOSIS — Z23 Encounter for immunization: Secondary | ICD-10-CM | POA: Insufficient documentation

## 2019-12-05 NOTE — Progress Notes (Signed)
   Covid-19 Vaccination Clinic  Name:  Derek Grant    MRN: [id number] DOB: Feb 18, 1956  12/05/2019  Derek Grant was observed post Covid-19 immunization for 15 minutes without incidence. He was provided with Vaccine Information Sheet and instruction to access the V-Safe system.   Derek Grant was instructed to call 911 with any severe reactions post vaccine: Marland Kitchen Difficulty breathing  . Swelling of your face and throat  . A fast heartbeat  . A bad rash all over your body  . Dizziness and weakness    Immunizations Administered    Name Date Dose VIS Date Route   Pfizer COVID-19 Vaccine 12/05/2019  8:59 AM 0.3 mL 10/03/2019 Intramuscular   Manufacturer: [place]   Lot: [id number]   [place]: [id number]

## 2019-12-22 DIAGNOSIS — M1711 Unilateral primary osteoarthritis, right knee: Secondary | ICD-10-CM | POA: Diagnosis not present

## 2019-12-22 DIAGNOSIS — M222X1 Patellofemoral disorders, right knee: Secondary | ICD-10-CM | POA: Diagnosis not present

## 2019-12-30 ENCOUNTER — Ambulatory Visit: Payer: Medicare HMO | Attending: Internal Medicine

## 2019-12-30 DIAGNOSIS — Z23 Encounter for immunization: Secondary | ICD-10-CM | POA: Insufficient documentation

## 2019-12-30 NOTE — Progress Notes (Signed)
   Covid-19 Vaccination Clinic  Name:  Derek Grant    MRN: [id number] DOB: 07-Jul-1956  12/30/2019  [name] was observed post Covid-19 immunization for 15 minutes without incident. He was provided with Vaccine Information Sheet and instruction to access the V-Safe system.   [name] was instructed to call 911 with any severe reactions post vaccine: Marland Kitchen Difficulty breathing  . Swelling of face and throat  . A fast heartbeat  . A bad rash all over body  . Dizziness and weakness   Immunizations Administered    Name Date Dose VIS Date Route   Pfizer COVID-19 Vaccine 12/30/2019  9:09 AM 0.3 mL 10/03/2019 Intramuscular   Manufacturer: [place]   Lot: [id number]   [place]: [id number]

## 2020-01-29 ENCOUNTER — Encounter: Payer: Self-pay | Admitting: Internal Medicine

## 2020-02-18 ENCOUNTER — Other Ambulatory Visit: Payer: Self-pay | Admitting: Internal Medicine

## 2020-02-18 DIAGNOSIS — J4520 Mild intermittent asthma, uncomplicated: Secondary | ICD-10-CM

## 2020-02-18 NOTE — Telephone Encounter (Signed)
Requested Prescriptions  Pending Prescriptions Disp Refills  . BREO ELLIPTA 100-25 MCG/INH AEPB [Pharmacy Med Name: BREO ELLIPTA 100-25MCG ORAL INH(30)] 180 each 5    Sig: INHALE 1 PUFF BY MOUTH LUNGS DAILY     Pulmonology:  Combination Products Passed - 02/18/2020 11:56 AM      Passed - Valid encounter within last 12 months    Recent Outpatient Visits          8 months ago Annual physical exam   [facility] Glean Hess, MD   2 years ago Need for influenza vaccination   [facility] Glean [facility], [facility]   3 years ago Annual physical exam   [facility] Glean Hess, MD   3 years ago Asthma, mild intermittent, uncomplicated   [facility] Glean Hess, MD

## 2020-05-12 ENCOUNTER — Encounter: Payer: Self-pay | Admitting: Internal Medicine

## 2020-05-12 ENCOUNTER — Ambulatory Visit (INDEPENDENT_AMBULATORY_CARE_PROVIDER_SITE_OTHER): Payer: Medicare HMO | Admitting: Internal Medicine

## 2020-05-12 ENCOUNTER — Other Ambulatory Visit: Payer: Self-pay

## 2020-05-12 VITALS — BP 114/76 | HR 73 | Temp 97.9°F | Ht 71.0 in | Wt 255.0 lb

## 2020-05-12 DIAGNOSIS — H6122 Impacted cerumen, left ear: Secondary | ICD-10-CM | POA: Diagnosis not present

## 2020-05-12 DIAGNOSIS — Z23 Encounter for immunization: Secondary | ICD-10-CM

## 2020-05-12 NOTE — Progress Notes (Signed)
Date:  05/12/2020   Name:  Derek Grant   DOB:  Dec 11, 1955   MRN:  [id number]   Chief Complaint: Cerumen Impaction Marland Kitchenpnue13, left ear, tested for hearing aids couldnt do test because wax in ear, wax is deep, feels like it draining, harder to hear, no pain  )  He has decreased hearing and went for hearing aids.  He was told that there was wax deep in his left ear.  He denies pain but sometimes feels like his ear is running.  HPI  Lab Results  Component Value Date   CREATININE 1.26 06/02/2019   BUN 13 06/02/2019   NA 138 06/02/2019   K 4.8 06/02/2019   CL 102 06/02/2019   CO2 24 06/02/2019   Lab Results  Component Value Date   CHOL 140 06/02/2019   HDL 48 06/02/2019   LDLCALC 81 06/02/2019   TRIG 57 06/02/2019   CHOLHDL 2.9 06/02/2019   No results found for: TSH No results found for: HGBA1C Lab Results  Component Value Date   WBC 4.5 06/02/2019   HGB 14.4 06/02/2019   HCT 42.2 06/02/2019   MCV 85 06/02/2019   PLT 227 06/02/2019   Lab Results  Component Value Date   ALT 21 06/02/2019   AST 20 06/02/2019   ALKPHOS 51 06/02/2019   BILITOT 0.2 06/02/2019     Review of Systems  Constitutional: Negative for chills and fever.  HENT: Positive for hearing loss. Negative for ear discharge and ear pain.   Neurological: Negative for dizziness and light-headedness.    Patient Active Problem List   Diagnosis Date Noted  . Osteoarthritis of knee 11/04/2019  . Patellofemoral stress syndrome 11/04/2019  . Decreased hearing of both ears 08/09/2016  . GERD (gastroesophageal reflux disease) 04/03/2016  . Adenomatous colon polyp 03/02/2015  . Asthma, mild intermittent 11/24/2014  . ED (erectile dysfunction) of organic origin 06/24/2014    No Known Allergies  Past Surgical History:  Procedure Laterality Date  . COLONOSCOPY  01/2015   adenomatous polyps    Social History   Tobacco Use  . Smoking status: Former Smoker    Types: Cigarettes    Quit date:  06/25/1999    Years since quitting: 20.8  . Smokeless tobacco: Never Used  Substance Use Topics  . Alcohol use: No    Alcohol/week: 0.0 standard drinks  . Drug use: No     Medication list has been reviewed and updated.  Current Meds  Medication Sig  . BREO ELLIPTA 100-25 MCG/INH AEPB INHALE 1 PUFF BY MOUTH LUNGS DAILY  . esomeprazole (NEXIUM) 40 MG capsule TAKE 1 CAPSULE(40 MG) BY MOUTH DAILY  . Glucosamine HCl (GLUCOSAMINE PO) Take by mouth.  . meloxicam (MOBIC) 15 MG tablet   . sildenafil (REVATIO) 20 MG tablet Take 3 tablets (60 mg total) by mouth daily as needed.    PHQ 2/9 Scores 05/12/2020 06/02/2019 08/20/2017 08/09/2016  PHQ - 2 Score 0 0 0 0  PHQ- 9 Score 0 - - -    GAD 7 : Generalized Anxiety Score 05/12/2020  Nervous, Anxious, on Edge 0  Control/stop worrying 0  Worry too much - different things 0  Trouble relaxing 0  Restless 0  Easily annoyed or irritable 0  Afraid - awful might happen 0  Total GAD 7 Score 0  Anxiety Difficulty Not difficult at all    BP Readings from Last 3 Encounters:  05/12/20 114/76  06/02/19 118/68  08/20/17 122/74  Physical Exam Vitals and nursing note reviewed.  Constitutional:      General: He is not in acute distress.    Appearance: Normal appearance. He is well-developed.  HENT:     Head: Normocephalic and atraumatic.     Right Ear: Tympanic membrane and ear canal normal. Decreased hearing noted.     Left Ear: Tympanic membrane normal. Decreased hearing noted.     Ears:     Comments: Small amount of cerumen deep in the left ear - flushed easily with complete resolution.  Patient tolerated procedure well. Pulmonary:     Effort: Pulmonary effort is normal. No respiratory distress.  Musculoskeletal:        General: Normal range of motion.  Skin:    General: Skin is warm and dry.     Findings: No rash.  Neurological:     Mental Status: He is alert and oriented to person, place, and time.  Psychiatric:        Behavior:  Behavior normal.        Thought Content: Thought content normal.     Wt Readings from Last 3 Encounters:  05/12/20 255 lb (115.7 kg)  06/02/19 255 lb (115.7 kg)  08/20/17 259 lb (117.5 kg)    BP 114/76   Pulse 73   Temp 97.9 F (36.6 C) (Oral)   Ht 5\' 11"  (1.803 m)   Wt 255 lb (115.7 kg)   SpO2 96%   BMI 35.57 kg/m   Assessment and Plan: 1. Excessive cerumen in ear canal, left Flushed easily with no complications He can now proceed with hearing aid evaluation  2. Need for vaccination for pneumococcus - Pneumococcal conjugate vaccine 13-valent IM   Partially dictated using Editor, commissioning. Any errors are unintentional.  Halina Maidens, MD [facility]  05/12/2020

## 2020-05-18 ENCOUNTER — Other Ambulatory Visit: Payer: Self-pay | Admitting: Internal Medicine

## 2020-05-18 DIAGNOSIS — K219 Gastro-esophageal reflux disease without esophagitis: Secondary | ICD-10-CM

## 2020-05-18 NOTE — Telephone Encounter (Signed)
Requested Prescriptions  Pending Prescriptions Disp Refills  . esomeprazole (NEXIUM) 40 MG capsule [Pharmacy Med Name: ESOMEPRAZOLE MAGNESIUM 40MG  DR CAPS] 30 capsule 5    Sig: TAKE 1 CAPSULE(40 MG) BY MOUTH DAILY     Gastroenterology: Proton Pump Inhibitors Passed - 05/18/2020  6:21 AM      Passed - Valid encounter within last 12 months    Recent Outpatient Visits          6 days ago Need for vaccination for pneumococcus   [facility] Glean Hess, MD   11 months ago Annual physical exam   [facility] Glean Hess, MD   2 years ago Need for influenza vaccination   [facility] Glean Hess, MD   3 years ago Annual physical exam   [facility] Glean Hess, [facility]   4 years ago Asthma, mild intermittent, uncomplicated   [facility] Glean Hess, MD      Future Appointments            In 2 months Army Melia Jesse Sans, MD [facility]

## 2020-06-02 ENCOUNTER — Encounter: Payer: No Typology Code available for payment source | Admitting: Internal Medicine

## 2020-07-19 ENCOUNTER — Encounter: Payer: Medicare HMO | Admitting: Internal Medicine

## 2020-07-30 ENCOUNTER — Encounter: Payer: Self-pay | Admitting: Internal Medicine

## 2020-07-30 ENCOUNTER — Other Ambulatory Visit: Payer: Self-pay

## 2020-07-30 ENCOUNTER — Ambulatory Visit (INDEPENDENT_AMBULATORY_CARE_PROVIDER_SITE_OTHER): Payer: Medicare HMO | Admitting: Internal Medicine

## 2020-07-30 VITALS — BP 118/70 | HR 60 | Temp 98.1°F | Ht 71.0 in | Wt 255.0 lb

## 2020-07-30 DIAGNOSIS — J45909 Unspecified asthma, uncomplicated: Secondary | ICD-10-CM | POA: Insufficient documentation

## 2020-07-30 DIAGNOSIS — Z23 Encounter for immunization: Secondary | ICD-10-CM

## 2020-07-30 DIAGNOSIS — J4520 Mild intermittent asthma, uncomplicated: Secondary | ICD-10-CM

## 2020-07-30 DIAGNOSIS — K219 Gastro-esophageal reflux disease without esophagitis: Secondary | ICD-10-CM | POA: Diagnosis not present

## 2020-07-30 DIAGNOSIS — Z125 Encounter for screening for malignant neoplasm of prostate: Secondary | ICD-10-CM | POA: Diagnosis not present

## 2020-07-30 DIAGNOSIS — Z0000 Encounter for general adult medical examination without abnormal findings: Secondary | ICD-10-CM

## 2020-07-30 DIAGNOSIS — Z1211 Encounter for screening for malignant neoplasm of colon: Secondary | ICD-10-CM | POA: Diagnosis not present

## 2020-07-30 DIAGNOSIS — Z6835 Body mass index (BMI) 35.0-35.9, adult: Secondary | ICD-10-CM | POA: Diagnosis not present

## 2020-07-30 LAB — POCT URINALYSIS DIPSTICK
Bilirubin, UA: NEGATIVE
Blood, UA: NEGATIVE
Glucose, UA: NEGATIVE
Ketones, UA: NEGATIVE
Leukocytes, UA: NEGATIVE
Nitrite, UA: NEGATIVE
Protein, UA: NEGATIVE
Spec Grav, UA: 1.01 (ref 1.010–1.025)
Urobilinogen, UA: 0.2 E.U./dL
pH, UA: 6.5 (ref 5.0–8.0)

## 2020-07-30 MED ORDER — ESOMEPRAZOLE MAGNESIUM 40 MG PO CPDR: 40.0000 mg | DELAYED_RELEASE_CAPSULE | Freq: Every day | ORAL | 3 refills | Status: DC

## 2020-07-30 MED ORDER — BREO ELLIPTA 100-25 MCG/INH IN AEPB: 1.0000 | INHALATION_SPRAY | Freq: Every day | RESPIRATORY_TRACT | 5 refills | Status: DC

## 2020-07-30 NOTE — Progress Notes (Signed)
Date:  07/30/2020   Name:  Derek Grant   DOB:  1956/06/24   MRN:  [id number]   Chief Complaint: Annual Exam, Knee Pain (Was having right knee pain last spring. Now having left knee pain- Using hemp cream on it and its helping. Having swelling every day. Hurts to bare weight. ), and Flu Vaccine (High dose. )  Derek Grant is a 64 y.o. male who presents today for his Complete Annual Exam. He feels well. He reports exercising - works a lot outside with animals and he yard . He reports he is sleeping well. 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  Colonoscopy: 01/2015 -  anal polyp repeat 2021  Immunization History  Administered Date(s) Administered  . Influenza,inj,Quad PF,6+ Mos 08/09/2016, 08/20/2017  . Influenza-Unspecified 07/23/2014, 09/28/2019  . PFIZER SARS-COV-2 Vaccination 12/05/2019, 12/30/2019  . Pneumococcal Conjugate-13 05/12/2020  . Pneumococcal Polysaccharide-23 08/20/2017  . Tdap 06/24/2009, 06/09/2016    Asthma There is no shortness of breath or wheezing. Associated symptoms include heartburn. Pertinent negatives include no appetite change, chest pain, headaches, myalgias or trouble swallowing. His symptoms are alleviated by beta-agonist and steroid inhaler. He reports significant improvement on treatment. His past medical history is significant for asthma.  Gastroesophageal Reflux He complains of heartburn. He reports no abdominal pain, no chest pain, no choking or no wheezing. This is a recurrent problem. The problem occurs occasionally. Pertinent negatives include no fatigue. He has tried a PPI for the symptoms. The treatment provided significant relief.  Knee Pain  There was no injury mechanism. The pain is present in the left knee and right knee. The quality of the pain is described as aching. The pain is mild. Treatments tried: mobic and Hemp oil topically.    Lab Results  Component Value Date   CREATININE 1.26 06/02/2019   BUN 13  06/02/2019   NA 138 06/02/2019   K 4.8 06/02/2019   CL 102 06/02/2019   CO2 24 06/02/2019   Lab Results  Component Value Date   CHOL 140 06/02/2019   HDL 48 06/02/2019   LDLCALC 81 06/02/2019   TRIG 57 06/02/2019   CHOLHDL 2.9 06/02/2019   No results found for: TSH No results found for: HGBA1C Lab Results  Component Value Date   WBC 4.5 06/02/2019   HGB 14.4 06/02/2019   HCT 42.2 06/02/2019   MCV 85 06/02/2019   PLT 227 06/02/2019   Lab Results  Component Value Date   ALT 21 06/02/2019   AST 20 06/02/2019   ALKPHOS 51 06/02/2019   BILITOT 0.2 06/02/2019     Review of Systems  Constitutional: Negative for appetite change, chills, diaphoresis, fatigue and unexpected weight change.  HENT: Positive for hearing loss. Negative for trouble swallowing.   Eyes: Negative for visual disturbance.  Respiratory: Negative for choking, shortness of breath and wheezing.   Cardiovascular: Negative for chest pain, palpitations and leg swelling.  Gastrointestinal: Positive for heartburn. Negative for abdominal pain, blood in stool, constipation and diarrhea.  Genitourinary: Negative for difficulty urinating, dysuria and frequency.  Musculoskeletal: Positive for arthralgias. Negative for back pain and myalgias.  Skin: Negative for color change and rash.  Neurological: Negative for dizziness, syncope and headaches.  Hematological: Negative for adenopathy.  Psychiatric/Behavioral: Negative for dysphoric mood and sleep disturbance.    Patient Active Problem List   Diagnosis Date Noted  . Asthma, well controlled 07/30/2020  . Osteoarthritis of knee 11/04/2019  . Patellofemoral stress syndrome  11/04/2019  . Decreased hearing of both ears 08/09/2016  . GERD (gastroesophageal reflux disease) 04/03/2016  . Adenomatous colon polyp 03/02/2015  . ED (erectile dysfunction) of organic origin 06/24/2014    No Known Allergies  Past Surgical History:  Procedure Laterality Date  .  COLONOSCOPY  01/2015   adenomatous polyps    Social History   Tobacco Use  . Smoking status: Former Smoker    Types: Cigarettes    Quit date: 06/25/1999    Years since quitting: 21.1  . Smokeless tobacco: Never Used  Substance Use Topics  . Alcohol use: No    Alcohol/week: 0.0 standard drinks  . Drug use: No     Medication list has been reviewed and updated.  Current Meds  Medication Sig  . BREO ELLIPTA 100-25 MCG/INH AEPB INHALE 1 PUFF BY MOUTH LUNGS DAILY  . esomeprazole (NEXIUM) 40 MG capsule TAKE 1 CAPSULE(40 MG) BY MOUTH DAILY  . Glucosamine HCl (GLUCOSAMINE PO) Take by mouth.  . meloxicam (MOBIC) 15 MG tablet   . sildenafil (REVATIO) 20 MG tablet Take 3 tablets (60 mg total) by mouth daily as needed.    PHQ 2/9 Scores 07/30/2020 05/12/2020 06/02/2019 08/20/2017  PHQ - 2 Score 0 0 0 0  PHQ- 9 Score 0 0 - -    GAD 7 : Generalized Anxiety Score 07/30/2020 05/12/2020  Nervous, Anxious, on Edge 0 0  Control/stop worrying 0 0  Worry too much - different things 0 0  Trouble relaxing 0 0  Restless 0 0  Easily annoyed or irritable 0 0  Afraid - awful might happen 0 0  Total GAD 7 Score 0 0  Anxiety Difficulty Not difficult at all Not difficult at all    BP Readings from Last 3 Encounters:  07/30/20 118/70  05/12/20 114/76  06/02/19 118/68    Physical Exam Vitals and nursing note reviewed.  Constitutional:      Appearance: Normal appearance. He is well-developed.  HENT:     Head: Normocephalic.     Right Ear: Tympanic membrane, ear canal and external ear normal. Decreased hearing noted.     Left Ear: Tympanic membrane, ear canal and external ear normal. Decreased hearing noted.     Nose: Nose normal.  Eyes:     Conjunctiva/sclera: Conjunctivae normal.     Pupils: Pupils are equal, round, and reactive to light.  Neck:     Thyroid: No thyromegaly.     Vascular: No carotid bruit.  Cardiovascular:     Rate and Rhythm: Normal rate and regular rhythm.     Heart  sounds: Normal heart sounds.  Pulmonary:     Effort: Pulmonary effort is normal.     Breath sounds: Normal breath sounds. No wheezing.  Chest:     Breasts:        Right: No mass.        Left: No mass.  Abdominal:     General: Bowel sounds are normal.     Palpations: Abdomen is soft.     Tenderness: There is no abdominal tenderness.  Musculoskeletal:        General: Normal range of motion.     Cervical back: Normal range of motion and neck supple.     Right knee: Crepitus present. No deformity or effusion.     Left knee: Crepitus present. No deformity or effusion.  Lymphadenopathy:     Cervical: No cervical adenopathy.  Skin:    General: Skin is warm and dry.  Neurological:     Mental Status: He is alert and oriented to person, place, and time.     Deep Tendon Reflexes: Reflexes are normal and symmetric.  Psychiatric:        Speech: Speech normal.        Behavior: Behavior normal.        Thought Content: Thought content normal.        Judgment: Judgment normal.     Wt Readings from Last 3 Encounters:  07/30/20 255 lb (115.7 kg)  05/12/20 255 lb (115.7 kg)  06/02/19 255 lb (115.7 kg)    BP 118/70   Pulse 60   Temp 98.1 F (36.7 C) (Oral)   Ht 5\' 11"  (1.803 m)   Wt 255 lb (115.7 kg)   SpO2 97%   BMI 35.57 kg/m   Assessment and Plan: 1. Annual physical exam Normal exam except for hearing Consider hearing aids Continue healthy diet and exercise - Comprehensive metabolic panel - Lipid panel - POCT urinalysis dipstick  2. Prostate cancer screening DRE deferred - PSA  3. Colon cancer screening Due for 5 yr colonoscopy - Ambulatory referral to Gastroenterology  4. Gastroesophageal reflux disease without esophagitis Symptoms well controlled on daily PPI No red flag signs such as weight loss, n/v, melena Will continue nexium. - CBC with Differential/Platelet - esomeprazole (NEXIUM) 40 MG capsule; Take 1 capsule (40 mg total) by mouth daily.  Dispense: 90  capsule; Refill: 3  5. Asthma, mild intermittent, well-controlled Very well controlled with no use of albuterol MDI Flu vaccine today PPV-23 next year - fluticasone furoate-vilanterol (BREO ELLIPTA) 100-25 MCG/INH AEPB; Inhale 1 puff into the lungs daily.  Dispense: 180 each; Refill: 5   Partially dictated using Editor, commissioning. Any errors are unintentional.  Halina Maidens, MD [facility]  07/30/2020

## 2020-07-31 LAB — COMPREHENSIVE METABOLIC PANEL
ALT: 23 IU/L (ref 0–44)
AST: 20 IU/L (ref 0–40)
Albumin/Globulin Ratio: 1.8 (ref 1.2–2.2)
Albumin: 4.2 g/dL (ref 3.8–4.8)
Alkaline Phosphatase: 47 IU/L (ref 44–121)
BUN/Creatinine Ratio: 17 (ref 10–24)
BUN: 19 mg/dL (ref 8–27)
Bilirubin Total: 0.3 mg/dL (ref 0.0–1.2)
CO2: 24 mmol/L (ref 20–29)
Calcium: 8.8 mg/dL (ref 8.6–10.2)
Chloride: 103 mmol/L (ref 96–106)
Creatinine, Ser: 1.15 mg/dL (ref 0.76–1.27)
GFR calc Af Amer: 77 mL/min/{1.73_m2} (ref >59)
GFR calc non Af Amer: 66 mL/min/{1.73_m2} (ref >59)
Globulin, Total: 2.3 g/dL (ref 1.5–4.5)
Glucose: 89 mg/dL (ref 65–99)
Potassium: 4.5 mmol/L (ref 3.5–5.2)
Sodium: 137 mmol/L (ref 134–144)
Total Protein: 6.5 g/dL (ref 6.0–8.5)

## 2020-07-31 LAB — LIPID PANEL
Chol/HDL Ratio: 2.9 ratio (ref 0.0–5.0)
Cholesterol, Total: 155 mg/dL (ref 100–199)
HDL: 53 mg/dL (ref >39)
LDL Chol Calc (NIH): 92 mg/dL (ref 0–99)
Triglycerides: 48 mg/dL (ref 0–149)
VLDL Cholesterol Cal: 10 mg/dL (ref 5–40)

## 2020-07-31 LAB — CBC WITH DIFFERENTIAL/PLATELET
Basophils Absolute: 0 10*3/uL (ref 0.0–0.2)
Basos: 1 %
EOS (ABSOLUTE): 0.4 10*3/uL (ref 0.0–0.4)
Eos: 10 %
Hematocrit: 43.7 % (ref 37.5–51.0)
Hemoglobin: 14 g/dL (ref 13.0–17.7)
Immature Grans (Abs): 0 10*3/uL (ref 0.0–0.1)
Immature Granulocytes: 0 %
Lymphocytes Absolute: 1.4 10*3/uL (ref 0.7–3.1)
Lymphs: 37 %
MCH: 28.1 pg (ref 26.6–33.0)
MCHC: 32 g/dL (ref 31.5–35.7)
MCV: 88 fL (ref 79–97)
Monocytes Absolute: 0.3 10*3/uL (ref 0.1–0.9)
Monocytes: 8 %
Neutrophils Absolute: 1.6 10*3/uL (ref 1.4–7.0)
Neutrophils: 44 %
Platelets: 248 10*3/uL (ref 150–450)
RBC: 4.98 x10E6/uL (ref 4.14–5.80)
RDW: 13.4 % (ref 11.6–15.4)
WBC: 3.8 10*3/uL (ref 3.4–10.8)

## 2020-07-31 LAB — PSA: Prostate Specific Ag, Serum: 3 ng/mL (ref 0.0–4.0)

## 2020-08-03 ENCOUNTER — Telehealth: Payer: Self-pay | Admitting: Internal Medicine

## 2020-08-03 NOTE — Telephone Encounter (Signed)
Left message for patient to call back and schedule Medicare Annual Wellness Visit (AWV)   This should be a telephone visit only=30 minutes.  No hx of AWV; please schedule at anytime with Denisa O'Brien-Blaney at [facility] to [facility] before 07/23/20. Pt did not complete. OK to schedule AWV-I

## 2020-08-11 ENCOUNTER — Telehealth (INDEPENDENT_AMBULATORY_CARE_PROVIDER_SITE_OTHER): Payer: Self-pay | Admitting: Gastroenterology

## 2020-08-11 ENCOUNTER — Other Ambulatory Visit: Payer: Self-pay

## 2020-08-11 DIAGNOSIS — D126 Benign neoplasm of colon, unspecified: Secondary | ICD-10-CM

## 2020-08-11 MED ORDER — NA SULFATE-K SULFATE-MG SULF 17.5-3.13-1.6 GM/177ML PO SOLN: 1.0000 | Freq: Once | ORAL | 0 refills | Status: AC

## 2020-08-11 NOTE — Progress Notes (Signed)
Gastroenterology Pre-Procedure Review  Request Date: Monday 08/30/20 Requesting Physician: Dr. Allen Norris  PATIENT REVIEW QUESTIONS: The patient responded to the following health history questions as indicated:    1. Are you having any GI issues? no 2. Do you have a personal history of Polyps? yes (chart noted adenomatous polyp colonoscopy performed 5 years ago with Dr. Gustavo Lah) 3. Do you have a family history of Colon Cancer or Polyps? no 4. Diabetes Mellitus? no 5. Joint replacements in the past 12 months?no 6. Major health problems in the past 3 months?no 7. Any artificial heart valves, MVP, or defibrillator?no    MEDICATIONS & ALLERGIES:    Patient reports the following regarding taking any anticoagulation/antiplatelet therapy:   Plavix, Coumadin, Eliquis, Xarelto, Lovenox, Pradaxa, Brilinta, or Effient? no Aspirin? no  Patient confirms/reports the following medications:  Current Outpatient Medications  Medication Sig Dispense Refill  . esomeprazole (NEXIUM) 40 MG capsule Take 1 capsule (40 mg total) by mouth daily. 90 capsule 3  . fluticasone furoate-vilanterol (BREO ELLIPTA) 100-25 MCG/INH AEPB Inhale 1 puff into the lungs daily. 180 each 5  . Glucosamine HCl (GLUCOSAMINE PO) Take by mouth.    Marland Kitchen ibuprofen (ADVIL) 800 MG tablet     . sildenafil (REVATIO) 20 MG tablet Take 3 tablets (60 mg total) by mouth daily as needed. 50 tablet 0  . Na Sulfate-K Sulfate-Mg Sulf 17.5-3.13-1.6 GM/177ML SOLN Take 1 kit by mouth once for 1 dose. 354 mL 0   No current facility-administered medications for this visit.    Patient confirms/reports the following allergies:  No Known Allergies  Orders Placed This Encounter  Procedures  . Procedural/ Surgical Case Request: COLONOSCOPY WITH PROPOFOL    Standing Status:   Standing    Number of Occurrences:   1    Order Specific Question:   Pre-op diagnosis    Answer:   history of adenomatous polyps    Order Specific Question:   CPT Code    Answer:    34742    AUTHORIZATION INFORMATION Primary Insurance: 1D#: Group #:  Secondary Insurance: 1D#: Group #:  SCHEDULE INFORMATION: Date: Monday 08/30/20 Time: Location:[facility]

## 2020-08-26 ENCOUNTER — Other Ambulatory Visit: Admission: RE | Admit: 2020-08-26 | Payer: Medicare HMO | Source: Ambulatory Visit

## 2020-08-30 DIAGNOSIS — Z6835 Body mass index (BMI) 35.0-35.9, adult: Secondary | ICD-10-CM | POA: Insufficient documentation

## 2020-08-31 ENCOUNTER — Other Ambulatory Visit: Payer: Self-pay

## 2020-08-31 ENCOUNTER — Encounter: Payer: Self-pay | Admitting: Gastroenterology

## 2020-09-02 ENCOUNTER — Other Ambulatory Visit
Admission: RE | Admit: 2020-09-02 | Discharge: 2020-09-02 | Disposition: A | Discharge status: Home / Self Care | Payer: Medicare HMO | Source: Ambulatory Visit | Attending: Gastroenterology | Admitting: Gastroenterology

## 2020-09-02 ENCOUNTER — Other Ambulatory Visit: Payer: Self-pay

## 2020-09-02 DIAGNOSIS — Z01812 Encounter for preprocedural laboratory examination: Secondary | ICD-10-CM | POA: Diagnosis not present

## 2020-09-02 DIAGNOSIS — Z20822 Contact with and (suspected) exposure to covid-19: Secondary | ICD-10-CM | POA: Diagnosis not present

## 2020-09-03 LAB — SARS CORONAVIRUS 2 (TAT 6-24 HRS): SARS Coronavirus 2: NEGATIVE

## 2020-09-03 NOTE — Discharge Instructions (Signed)
General Anesthesia, Adult, Care After This sheet gives you information about how to care for yourself after your procedure. Your health care provider may also give you more specific instructions. If you have problems or questions, contact your health care provider. What can I expect after the procedure? After the procedure, the following side effects are common:  Pain or discomfort at the IV site.  Nausea.  Vomiting.  Sore throat.  Trouble concentrating.  Feeling cold or chills.  Weak or tired.  Sleepiness and fatigue.  Soreness and body aches. These side effects can affect parts of the body that were not involved in surgery. Follow these instructions at home:  For at least 24 hours after the procedure:  Have a responsible adult stay with you. It is important to have someone help care for you until you are awake and alert.  Rest as needed.  Do not: ? Participate in activities in which you could fall or become injured. ? Drive. ? Use heavy machinery. ? Drink alcohol. ? Take sleeping pills or medicines that cause drowsiness. ? Make important decisions or sign legal documents. ? Take care of children on your own. Eating and drinking  Follow any instructions from your health care provider about eating or drinking restrictions.  When you feel hungry, start by eating small amounts of foods that are soft and easy to digest (bland), such as toast. Gradually return to your regular diet.  Drink enough fluid to keep your urine pale yellow.  If you vomit, rehydrate by drinking water, juice, or clear broth. General instructions  If you have sleep apnea, surgery and certain medicines can increase your risk for breathing problems. Follow instructions from your health care provider about wearing your sleep device: ? Anytime you are sleeping, including during daytime naps. ? While taking prescription pain medicines, sleeping medicines, or medicines that make you drowsy.  Return to  your normal activities as told by your health care provider. Ask your health care provider what activities are safe for you.  Take over-the-counter and prescription medicines only as told by your health care provider.  If you smoke, do not smoke without supervision.  Keep all follow-up visits as told by your health care provider. This is important. Contact a health care provider if:  You have nausea or vomiting that does not get better with medicine.  You cannot eat or drink without vomiting.  You have pain that does not get better with medicine.  You are unable to pass urine.  You develop a skin rash.  You have a fever.  You have redness around your IV site that gets worse. Get help right away if:  You have difficulty breathing.  You have chest pain.  You have blood in your urine or stool, or you vomit blood. Summary  After the procedure, it is common to have a sore throat or nausea. It is also common to feel tired.  Have a responsible adult stay with you for the first 24 hours after general anesthesia. It is important to have someone help care for you until you are awake and alert.  When you feel hungry, start by eating small amounts of foods that are soft and easy to digest (bland), such as toast. Gradually return to your regular diet.  Drink enough fluid to keep your urine pale yellow.  Return to your normal activities as told by your health care provider. Ask your health care provider what activities are safe for you. This information is not   intended to replace advice given to you by your health care provider. Make sure you discuss any questions you have with your health care provider. Document Revised: 10/12/2017 Document Reviewed: 05/25/2017 Elsevier Patient Education  2020 [employer]vier Inc[employer]  

## 2020-09-06 ENCOUNTER — Ambulatory Visit: Payer: Medicare HMO | Admitting: Anesthesiology

## 2020-09-06 ENCOUNTER — Ambulatory Visit
Admission: RE | Admit: 2020-09-06 | Discharge: 2020-09-06 | Disposition: A | Discharge status: Home / Self Care | Payer: Medicare HMO | Attending: Gastroenterology | Admitting: Gastroenterology

## 2020-09-06 ENCOUNTER — Encounter
Admission: RE | Disposition: A | Discharge status: Home / Self Care | Payer: Self-pay | Source: Home / Self Care | Attending: Gastroenterology

## 2020-09-06 ENCOUNTER — Encounter: Payer: Self-pay | Admitting: Gastroenterology

## 2020-09-06 ENCOUNTER — Other Ambulatory Visit: Payer: Self-pay

## 2020-09-06 DIAGNOSIS — K5730 Diverticulosis of large intestine without perforation or abscess without bleeding: Secondary | ICD-10-CM | POA: Insufficient documentation

## 2020-09-06 DIAGNOSIS — D126 Benign neoplasm of colon, unspecified: Secondary | ICD-10-CM

## 2020-09-06 DIAGNOSIS — Z87891 Personal history of nicotine dependence: Secondary | ICD-10-CM | POA: Insufficient documentation

## 2020-09-06 DIAGNOSIS — Z820 Family history of epilepsy and other diseases of the nervous system: Secondary | ICD-10-CM | POA: Insufficient documentation

## 2020-09-06 DIAGNOSIS — Z8042 Family history of malignant neoplasm of prostate: Secondary | ICD-10-CM | POA: Diagnosis not present

## 2020-09-06 DIAGNOSIS — Z7951 Long term (current) use of inhaled steroids: Secondary | ICD-10-CM | POA: Diagnosis not present

## 2020-09-06 DIAGNOSIS — K219 Gastro-esophageal reflux disease without esophagitis: Secondary | ICD-10-CM | POA: Insufficient documentation

## 2020-09-06 DIAGNOSIS — K640 First degree hemorrhoids: Secondary | ICD-10-CM | POA: Insufficient documentation

## 2020-09-06 DIAGNOSIS — Z800 Family history of malignant neoplasm of digestive organs: Secondary | ICD-10-CM | POA: Diagnosis not present

## 2020-09-06 DIAGNOSIS — J45909 Unspecified asthma, uncomplicated: Secondary | ICD-10-CM | POA: Diagnosis not present

## 2020-09-06 DIAGNOSIS — Z791 Long term (current) use of non-steroidal anti-inflammatories (NSAID): Secondary | ICD-10-CM | POA: Diagnosis not present

## 2020-09-06 DIAGNOSIS — Z860100 Personal history of colon polyps, unspecified: Secondary | ICD-10-CM

## 2020-09-06 DIAGNOSIS — K621 Rectal polyp: Secondary | ICD-10-CM | POA: Diagnosis not present

## 2020-09-06 DIAGNOSIS — Z1211 Encounter for screening for malignant neoplasm of colon: Secondary | ICD-10-CM | POA: Diagnosis not present

## 2020-09-06 DIAGNOSIS — Z833 Family history of diabetes mellitus: Secondary | ICD-10-CM | POA: Diagnosis not present

## 2020-09-06 HISTORY — PX: POLYPECTOMY: SHX5525

## 2020-09-06 HISTORY — PX: COLONOSCOPY WITH PROPOFOL: SHX5780

## 2020-09-06 HISTORY — DX: Gastro-esophageal reflux disease without esophagitis: K21.9

## 2020-09-06 SURGERY — COLONOSCOPY WITH PROPOFOL
Anesthesia: General | Site: Rectum

## 2020-09-06 MED ORDER — PROPOFOL 10 MG/ML IV BOLUS
INTRAVENOUS | Status: DC | PRN
  Administered 2020-09-06: 100 mg via INTRAVENOUS
  Administered 2020-09-06 (×2): 50 mg via INTRAVENOUS
  Administered 2020-09-06: 40 mg via INTRAVENOUS

## 2020-09-06 MED ORDER — STERILE WATER FOR IRRIGATION IR SOLN
Status: DC | PRN
  Administered 2020-09-06: .05 mL

## 2020-09-06 MED ORDER — ACETAMINOPHEN 160 MG/5ML PO SOLN: 325.0000 mg | ORAL | Status: DC | PRN

## 2020-09-06 MED ORDER — SODIUM CHLORIDE 0.9 % IV SOLN: INTRAVENOUS | Status: DC

## 2020-09-06 MED ORDER — ACETAMINOPHEN 325 MG PO TABS: 325.0000 mg | ORAL_TABLET | ORAL | Status: DC | PRN

## 2020-09-06 MED ORDER — LIDOCAINE HCL (CARDIAC) PF 100 MG/5ML IV SOSY
PREFILLED_SYRINGE | INTRAVENOUS | Status: DC | PRN
  Administered 2020-09-06: 30 mg via INTRAVENOUS

## 2020-09-06 MED ORDER — LACTATED RINGERS IV SOLN: INTRAVENOUS | Status: DC

## 2020-09-06 SURGICAL SUPPLY — 25 items
CLIP HMST 235XBRD CATH ROT (MISCELLANEOUS) IMPLANT
CLIP RESOLUTION 360 11X235 (MISCELLANEOUS)
ELECT REM PT RETURN 9FT ADLT (ELECTROSURGICAL)
ELECTRODE REM PT RTRN 9FT ADLT (ELECTROSURGICAL) IMPLANT
FCP ESCP3.2XJMB 240X2.8X (MISCELLANEOUS)
FORCEPS BIOP RAD 4 LRG CAP 4 (CUTTING FORCEPS) ×3 IMPLANT
FORCEPS BIOP RJ4 240 W/NDL (MISCELLANEOUS)
FORCEPS ESCP3.2XJMB 240X2.8X (MISCELLANEOUS) IMPLANT
GOWN CVR UNV OPN BCK APRN NK (MISCELLANEOUS) ×4 IMPLANT
GOWN ISOL THUMB LOOP REG UNIV (MISCELLANEOUS) ×6
INJECTOR VARIJECT VIN23 (MISCELLANEOUS) IMPLANT
KIT DEFENDO VALVE AND CONN (KITS) IMPLANT
KIT PRC NS LF DISP ENDO (KITS) ×2 IMPLANT
KIT PROCEDURE OLYMPUS (KITS) ×3
MANIFOLD NEPTUNE II (INSTRUMENTS) ×3 IMPLANT
MARKER SPOT ENDO TATTOO 5ML (MISCELLANEOUS) IMPLANT
PROBE APC STR FIRE (PROBE) IMPLANT
RETRIEVER NET ROTH 2.5X230 LF (MISCELLANEOUS) IMPLANT
SNARE SHORT THROW 13M SML OVAL (MISCELLANEOUS) IMPLANT
SNARE SHORT THROW 30M LRG OVAL (MISCELLANEOUS) IMPLANT
SNARE SNG USE RND 15MM (INSTRUMENTS) IMPLANT
SPOT EX ENDOSCOPIC TATTOO (MISCELLANEOUS)
TRAP ETRAP POLY (MISCELLANEOUS) IMPLANT
VARIJECT INJECTOR VIN23 (MISCELLANEOUS)
WATER STERILE IRR 250ML POUR (IV SOLUTION) ×3 IMPLANT

## 2020-09-06 NOTE — Anesthesia Procedure Notes (Signed)
Date/Time: 09/06/2020 9:09 AM Performed by: Cameron Ali, CRNA Pre-anesthesia Checklist: Patient identified, Emergency Drugs available, Suction available, Timeout performed and Patient being monitored Patient Re-evaluated:Patient Re-evaluated prior to induction Oxygen Delivery Method: Nasal cannula Placement Confirmation: positive ETCO2

## 2020-09-06 NOTE — Anesthesia Preprocedure Evaluation (Signed)
Anesthesia Evaluation  Patient identified by MRN, date of birth, ID band Patient awake    Reviewed: Allergy & Precautions, H&P , NPO status , Patient's Chart, lab work & pertinent test results, reviewed documented beta blocker date and time   Airway Mallampati: II  TM Distance: >3 FB Neck ROM: full    Dental no notable dental hx.    Pulmonary asthma , former smoker,    Pulmonary exam normal breath sounds clear to auscultation       Cardiovascular Exercise Tolerance: Good negative cardio ROS   Rhythm:regular Rate:Normal     Neuro/Psych negative neurological ROS  negative psych ROS   GI/Hepatic Neg liver ROS, GERD  Medicated and Controlled,  Endo/Other  negative endocrine ROS  Renal/GU negative Renal ROS  negative genitourinary   Musculoskeletal   Abdominal   Peds  Hematology negative hematology ROS (+)   Anesthesia Other Findings   Reproductive/Obstetrics negative OB ROS                             Anesthesia Physical Anesthesia Plan  ASA: II  Anesthesia Plan: General   Post-op Pain Management:    Induction:   PONV Risk Score and Plan:   Airway Management Planned:   Additional Equipment:   Intra-op Plan:   Post-operative Plan:   Informed Consent: I have reviewed the patients History and Physical, chart, labs and discussed the procedure including the risks, benefits and alternatives for the proposed anesthesia with the patient or authorized representative who has indicated his/her understanding and acceptance.     Dental Advisory Given  Plan Discussed with: CRNA  Anesthesia Plan Comments:         Anesthesia Quick Evaluation

## 2020-09-06 NOTE — Anesthesia Postprocedure Evaluation (Signed)
Anesthesia Post Note  Patient: Derek Grant  Procedure(s) Performed: COLONOSCOPY WITH PROPOFOL (N/A ) POLYPECTOMY (Rectum)     Patient location during evaluation: PACU Anesthesia Type: General Level of consciousness: awake and alert Pain management: pain level controlled Vital Signs Assessment: post-procedure vital signs reviewed and stable Respiratory status: spontaneous breathing, nonlabored ventilation, respiratory function stable and patient connected to nasal cannula oxygen Cardiovascular status: blood pressure returned to baseline and stable Postop Assessment: no apparent nausea or vomiting Anesthetic complications: no   No complications documented.  Trecia Rogers

## 2020-09-06 NOTE — Op Note (Signed)
[facility] Gastroenterology Patient Name: Derek Grant Procedure Date: 09/06/2020 8:58 AM MRN: [id number] Account #: [id number] Date of Birth: 1956/05/03 Admit Type: Outpatient Age: 64 Room: [facility] OR ROOM 01 Gender: Male Note Status: Finalized Procedure:             Colonoscopy Indications:           High risk colon cancer surveillance: Personal history                         of colonic polyps Providers:             Lucilla Lame MD, MD Referring MD:          Halina Maidens, MD (Referring MD) Medicines:             Propofol per Anesthesia Complications:         No immediate complications. Procedure:             Pre-Anesthesia Assessment:                        - Prior to the procedure, a History and Physical was                         performed, and patient medications and allergies were                         reviewed. The patient's tolerance of previous                         anesthesia was also reviewed. The risks and benefits                         of the procedure and the sedation options and risks                         were discussed with the patient. All questions were                         answered, and informed consent was obtained. Prior                         Anticoagulants: The patient has taken no previous                         anticoagulant or antiplatelet agents. ASA Grade                         Assessment: II - A patient with mild systemic disease.                         After reviewing the risks and benefits, the patient                         was deemed in satisfactory condition to undergo the                         procedure.  After obtaining informed consent, the colonoscope was                         passed under direct vision. Throughout the procedure,                         the patient's blood pressure, pulse, and oxygen                         saturations were monitored continuously. The was                          introduced through the anus and advanced to the the                         cecum, identified by appendiceal orifice and ileocecal                         valve. The colonoscopy was performed without                         difficulty. The patient tolerated the procedure well.                         The quality of the bowel preparation was excellent. Findings:      The perianal and digital rectal examinations were normal.      A 4 mm polyp was found in the rectum. The polyp was sessile. The polyp       was removed with a cold biopsy forceps. Resection and retrieval were       complete.      Non-bleeding internal hemorrhoids were found during retroflexion. The       hemorrhoids were Grade I (internal hemorrhoids that do not prolapse).      Multiple small-mouthed diverticula were found in the sigmoid colon. Impression:            - One 4 mm polyp in the rectum, removed with a cold                         biopsy forceps. Resected and retrieved.                        - Non-bleeding internal hemorrhoids.                        - Diverticulosis in the sigmoid colon. Recommendation:        - Discharge patient to home.                        - Resume previous diet.                        - Continue present medications.                        - Await pathology results.                        - Repeat colonoscopy in 7 years for surveillance. Procedure Code(s):     --- Professional ---  45380, Colonoscopy, flexible; with biopsy, single or                         multiple Diagnosis Code(s):     --- Professional ---                        Z86.010, Personal history of colonic polyps                        K62.1, Rectal polyp CPT copyright 2019 American Medical Association. All rights reserved. The codes documented in this report are preliminary and upon coder review may  be revised to meet current compliance requirements. Lucilla Lame MD, MD 09/06/2020  9:27:57 AM This report has been signed electronically. Number of Addenda: 0 Note Initiated On: 09/06/2020 8:58 AM Scope Withdrawal Time: 0 hours 9 minutes 43 seconds  Total Procedure Duration: 0 hours 12 minutes 0 seconds  Estimated Blood Loss:  Estimated blood loss: none.      [facility]

## 2020-09-06 NOTE — H&P (Signed)
Lucilla Lame, MD [facility] [address] [place], [place] [address] Phone:[phone] Fax : [phone]  Primary Care Physician:  Glean Hess, MD Primary Gastroenterologist:  Dr. Allen Norris  Pre-Procedure History & Physical: HPI:  Derek Grant is a 64 y.o. male is here for an colonoscopy.   Past Medical History:  Diagnosis Date  . Asthma   . GERD (gastroesophageal reflux disease)     Past Surgical History:  Procedure Laterality Date  . COLONOSCOPY  01/2015   adenomatous polyps    Prior to Admission medications   Medication Sig Start Date End Date Taking? Authorizing Provider  esomeprazole (NEXIUM) 40 MG capsule Take 1 capsule (40 mg total) by mouth daily. 07/30/20  Yes Glean Hess, MD  fluticasone furoate-vilanterol (BREO ELLIPTA) 100-25 MCG/INH AEPB Inhale 1 puff into the lungs daily. 07/30/20  Yes Glean Hess, MD  Glucosamine HCl (GLUCOSAMINE PO) Take by mouth.   Yes [provider]  sildenafil (REVATIO) 20 MG tablet Take 3 tablets (60 mg total) by mouth daily as needed. 06/02/19  Yes Glean Hess, MD  ibuprofen (ADVIL) 800 MG tablet  07/05/20   [provider]    Allergies as of 08/11/2020  . (No Known Allergies)    Family History  Problem Relation Age of Onset  . Diabetes Mother   . Alzheimer's disease Father   . Throat cancer Brother   . Prostate cancer Paternal Uncle     Social History   Socioeconomic History  . Marital status: Married    Spouse name: Not on file  . Number of children: Not on file  . Years of education: Not on file  . Highest education level: Not on file  Occupational History  . Not on file  Tobacco Use  . Smoking status: Former Smoker    Types: Cigarettes    Quit date: 06/25/1999    Years since quitting: 21.2  . Smokeless tobacco: Never Used  Vaping Use  . Vaping Use: Never used  Substance and Sexual Activity  . Alcohol use: No    Alcohol/week: 0.0 standard drinks  . Drug use: No  .  Sexual activity: Not on file  Other Topics Concern  . Not on file  Social History Narrative   Employed training dogs for field trials   [employer]   Works on a farm bailing hay as well   Social Determinants of Radio broadcast assistant Strain:   . Difficulty of Paying Living Expenses: Not on file  Food Insecurity:   . Worried About Charity fundraiser in the Last Year: Not on file  . Ran Out of Food in the Last Year: Not on file  Transportation Needs:   . Lack of Transportation (Medical): Not on file  . Lack of Transportation (Non-Medical): Not on file  Physical Activity:   . Days of Exercise per Week: Not on file  . Minutes of Exercise per Session: Not on file  Stress:   . Feeling of Stress : Not on file  Social Connections:   . Frequency of Communication with Friends and Family: Not on file  . Frequency of Social Gatherings with Friends and Family: Not on file  . Attends Religious Services: Not on file  . Active Member of Clubs or Organizations: Not on file  . Attends Archivist Meetings: Not on file  . Marital Status: Not on file  Intimate Partner Violence:   . Fear of Current or Ex-Partner:  Not on file  . Emotionally Abused: Not on file  . Physically Abused: Not on file  . Sexually Abused: Not on file    Review of Systems: See HPI, otherwise negative ROS  Physical Exam: BP (!) 129/92   Pulse (!) 59   Temp (!) 97.5 F (36.4 C) (Temporal)   Resp 16   Ht 5\' 11"  (1.803 m)   Wt 111.1 kg   SpO2 99%   BMI 34.17 kg/m  General:   Alert,  pleasant and cooperative in NAD Head:  Normocephalic and atraumatic. Neck:  Supple; no masses or thyromegaly. Lungs:  Clear throughout to auscultation.    Heart:  Regular rate and rhythm. Abdomen:  Soft, nontender and nondistended. Normal bowel sounds, without guarding, and without rebound.   Neurologic:  Alert and  oriented x4;  grossly normal neurologically.  Impression/Plan: Derek Grant is here  for an colonoscopy to be performed for a history of adenomatous polyps on 01/2015   Risks, benefits, limitations, and alternatives regarding  colonoscopy have been reviewed with the patient.  Questions have been answered.  All parties agreeable.   Lucilla Lame, MD  09/06/2020, 9:00 AM

## 2020-09-06 NOTE — Transfer of Care (Signed)
Immediate Anesthesia Transfer of Care Note  Patient: Derek Grant  Procedure(s) Performed: COLONOSCOPY WITH PROPOFOL (N/A ) POLYPECTOMY (Rectum)  Patient Location: PACU  Anesthesia Type: General  Level of Consciousness: awake, alert  and patient cooperative  Airway and Oxygen Therapy: Patient Spontanous Breathing and Patient connected to supplemental oxygen  Post-op Assessment: Post-op Vital signs reviewed, Patient's Cardiovascular Status Stable, Respiratory Function Stable, Patent Airway and No signs of Nausea or vomiting  Post-op Vital Signs: Reviewed and stable  Complications: No complications documented.

## 2020-09-07 ENCOUNTER — Encounter: Payer: Self-pay | Admitting: Gastroenterology

## 2020-09-08 LAB — SURGICAL PATHOLOGY

## 2020-09-09 ENCOUNTER — Encounter: Payer: Self-pay | Admitting: Gastroenterology

## 2020-09-21 DIAGNOSIS — J45998 Other asthma: Secondary | ICD-10-CM | POA: Diagnosis not present

## 2020-09-21 DIAGNOSIS — M13869 Other specified arthritis, unspecified knee: Secondary | ICD-10-CM | POA: Diagnosis not present

## 2020-09-23 ENCOUNTER — Ambulatory Visit: Payer: Medicare HMO | Admitting: Internal Medicine

## 2020-09-27 DIAGNOSIS — M170 Bilateral primary osteoarthritis of knee: Secondary | ICD-10-CM | POA: Diagnosis not present

## 2020-12-13 ENCOUNTER — Other Ambulatory Visit: Payer: Self-pay | Admitting: Internal Medicine

## 2020-12-13 ENCOUNTER — Telehealth: Payer: Self-pay

## 2020-12-13 DIAGNOSIS — N529 Male erectile dysfunction, unspecified: Secondary | ICD-10-CM

## 2020-12-13 MED ORDER — SILDENAFIL CITRATE 20 MG PO TABS: 60.0000 mg | ORAL_TABLET | Freq: Every day | ORAL | 0 refills | Status: DC | PRN

## 2020-12-13 NOTE — Telephone Encounter (Signed)
Pt needs refill sent to [facility] in [place]   sildenafil (REVATIO) 20 MG tablet [[id number]]

## 2020-12-13 NOTE — Telephone Encounter (Signed)
Please review. Last office visit 07/30/2020. Pt hasn't had a refill since 06/02/2019.  KP

## 2020-12-21 DIAGNOSIS — M170 Bilateral primary osteoarthritis of knee: Secondary | ICD-10-CM | POA: Diagnosis not present

## 2020-12-29 ENCOUNTER — Ambulatory Visit: Payer: Medicare HMO

## 2021-01-03 ENCOUNTER — Ambulatory Visit: Payer: Medicare HMO

## 2021-01-24 ENCOUNTER — Ambulatory Visit (INDEPENDENT_AMBULATORY_CARE_PROVIDER_SITE_OTHER): Payer: Medicare HMO

## 2021-01-24 VITALS — Ht 71.0 in | Wt 250.0 lb

## 2021-01-24 DIAGNOSIS — Z0000 Encounter for general adult medical examination without abnormal findings: Secondary | ICD-10-CM | POA: Diagnosis not present

## 2021-01-24 DIAGNOSIS — Z0100 Encounter for examination of eyes and vision without abnormal findings: Secondary | ICD-10-CM

## 2021-01-24 NOTE — Progress Notes (Signed)
Subjective:   Derek Grant is a 65 y.o. male who presents for an Initial Medicare Annual Wellness Visit.  Virtual Visit via Telephone Note  I connected with  Derek Grant on 01/24/21 at 10:00 AM EDT by telephone and verified that I am speaking with the correct person using two identifiers.  Location: Patient: home Provider: [facility] Persons participating in the virtual visit: [place]   I discussed the limitations, risks, security and privacy concerns of performing an evaluation and management service by telephone and the availability of in person appointments. The patient expressed understanding and agreed to proceed.  Interactive audio and video telecommunications were attempted between this nurse and patient, however failed, due to patient having technical difficulties OR patient did not have access to video capability.  We continued and completed visit with audio only.  Some vital signs may be absent or patient reported.   [name], LPN    Review of Systems     Cardiac Risk Factors include: advanced age (>73men, >31 women);male gender;obesity (BMI >30kg/m2)     Objective:    Today's Vitals   01/24/21 1006  Weight: 250 lb (113.4 kg)  Height: 5\' 11"  (1.803 m)  PainSc: 7    Body mass index is 34.87 kg/m.  Advanced Directives 01/24/2021 09/06/2020 08/09/2016 06/27/2016 06/09/2016  Does Patient Have a Medical Advance Directive? No No No No No  Would patient like information on creating a medical advance directive? Yes (MAU/Ambulatory/Procedural Areas - Information given) Yes (MAU/Ambulatory/Procedural Areas - Information given) - No - patient declined information No - patient declined information    Current Medications (verified) Outpatient Encounter Medications as of 01/24/2021  Medication Sig  . acetaminophen (TYLENOL) 650 MG CR tablet Take 1,300 mg by mouth every 8 (eight) hours as needed for pain.  Marland Kitchen esomeprazole (NEXIUM) 40 MG capsule Take 1  capsule (40 mg total) by mouth daily.  . fluticasone furoate-vilanterol (BREO ELLIPTA) 100-25 MCG/INH AEPB Inhale 1 puff into the lungs daily.  . Glucosamine HCl (GLUCOSAMINE PO) Take by mouth.  Marland Kitchen ibuprofen (ADVIL) 200 MG tablet Take 200 mg by mouth every 6 (six) hours as needed. Pt taking up to 600 mg PRN; pt aware not to take when taking meloxicam  . meloxicam (MOBIC) 15 MG tablet Take 15 mg by mouth as needed.  . sildenafil (REVATIO) 20 MG tablet Take 3 tablets (60 mg total) by mouth daily as needed.  . [DISCONTINUED] ibuprofen (ADVIL) 800 MG tablet  (Patient not taking: Reported on 08/31/2020)   No facility-administered encounter medications on file as of 01/24/2021.    Allergies (verified) Patient has no known allergies.   History: Past Medical History:  Diagnosis Date  . Arthritis   . Asthma   . GERD (gastroesophageal reflux disease)    Past Surgical History:  Procedure Laterality Date  . COLONOSCOPY  01/2015   adenomatous polyps  . COLONOSCOPY WITH PROPOFOL N/A 09/06/2020   Procedure: COLONOSCOPY WITH PROPOFOL;  Surgeon: Lucilla Lame, MD;  Location: [place];  Service: Endoscopy;  Laterality: N/A;  priority 4  . POLYPECTOMY  09/06/2020   Procedure: POLYPECTOMY;  Surgeon: Lucilla Lame, MD;  Location: [place];  Service: Endoscopy;;   Family History  Problem Relation Age of Onset  . Diabetes Mother   . Alzheimer's disease Father   . Throat cancer Brother   . Prostate cancer Paternal Uncle    Social History   Socioeconomic History  . Marital status: Divorced  Spouse name: Not on file  . Number of children: 2  . Years of education: Not on file  . Highest education level: Not on file  Occupational History  . Not on file  Tobacco Use  . Smoking status: Former Smoker    Types: Cigarettes    Quit date: 06/25/1999    Years since quitting: 21.6  . Smokeless tobacco: Never Used  Vaping Use  . Vaping Use: Never used  Substance and Sexual Activity   . Alcohol use: No    Alcohol/week: 0.0 standard drinks  . Drug use: No  . Sexual activity: Not on file  Other Topics Concern  . Not on file  Social History Narrative   Employed training dogs for field trials at [employer]   Works on a farm bailing hay as well   Social Determinants of Radio broadcast assistant Strain: [place]   . Difficulty of Paying Living Expenses: Not hard at all  Food Insecurity: No Food Insecurity  . Worried About Charity fundraiser in the Last Year: Never true  . Ran Out of Food in the Last Year: Never true  Transportation Needs: No Transportation Needs  . Lack of Transportation (Medical): No  . Lack of Transportation (Non-Medical): No  Physical Activity: Sufficiently Active  . Days of Exercise per Week: 7 days  . Minutes of Exercise per Session: 60 min  Stress: No Stress Concern Present  . Feeling of Stress : Not at all  Social Connections: Socially Isolated  . Frequency of Communication with Friends and Family: More than three times a week  . Frequency of Social Gatherings with Friends and Family: More than three times a week  . Attends Religious Services: Never  . Active Member of Clubs or Organizations: No  . Attends Archivist Meetings: Never  . Marital Status: Divorced    Tobacco Counseling Counseling given: Not Answered   Clinical Intake:  Pre-visit preparation completed: Yes  Pain : 0-10 Pain Score: 7  Pain Type: Chronic pain Pain Location: Knee Pain Orientation: Right,Left Pain Descriptors / Indicators: Aching,Sore,Other (Comment) (swelling) Pain Onset: More than a month ago Pain Frequency: Constant     BMI - recorded: 34.87 Nutritional Status: BMI > 30  Obese Nutritional Risks: None Diabetes: No  How often do you need to have someone help you when you read instructions, pamphlets, or other written materials from your doctor or pharmacy?: 1 - Never   Interpreter Needed?:  No  Information entered by :: [name] LPN   Activities of Daily Living In your present state of health, do you have any difficulty performing the following activities: 01/24/2021 09/06/2020  Hearing? Y N  Comment plans to get hearing aids soon -  Vision? N N  Difficulty concentrating or making decisions? N N  Walking or climbing stairs? N N  Dressing or bathing? N N  Doing errands, shopping? N -  Preparing Food and eating ? N -  Using the Toilet? N -  In the past six months, have you accidently leaked urine? N -  Do you have problems with loss of bowel control? N -  Managing your Medications? N -  Managing your Finances? N -  Housekeeping or managing your Housekeeping? N -  Some recent data might be hidden    Patient Care Team: Glean Hess, MD as PCP - General (Internal Medicine) Lucilla Lame, MD as Consulting Physician (Gastroenterology) Earnestine Leys, MD (Orthopedic  Surgery)  Indicate any recent Medical Services you may have received from other than [facility] providers in the past year (date may be approximate).     Assessment:   This is a routine wellness examination for Derek Grant.  Hearing/Vision screen  Hearing Screening   125Hz  250Hz  500Hz  1000Hz  2000Hz  3000Hz  4000Hz  6000Hz  8000Hz   Right ear:           Left ear:           Comments: Pt c/o mild hearing difficulty; hearing evaluation done at [facility] in [place] - pt plans to get hearing aids in the near future  Vision Screening Comments: Pt wears reading glasses; past due for eye exam - referral to [facility] today.   Dietary issues and exercise activities discussed: Current Exercise Habits: The patient has a physically strenuous job, but has no regular exercise apart from work., Type of exercise: walking, Time (Minutes): > 60, Frequency (Times/Week): 7, Weekly Exercise (Minutes/Week): 0, Intensity: Mild, Exercise limited by: orthopedic condition(s)  Goals    . Patient Stated     Patient  states he would like to improve knee pain and mobility.       Depression Screen PHQ 2/9 Scores 01/24/2021 07/30/2020 05/12/2020 06/02/2019 08/20/2017 08/09/2016 04/03/2016  PHQ - 2 Score 0 0 0 0 0 0 0  PHQ- 9 Score - 0 0 - - - -    Fall Risk Fall Risk  01/24/2021 07/30/2020 05/12/2020 06/02/2019 08/09/2016  Falls in the past year? 0 0 0 0 Yes  Number falls in past yr: 0 0 - 0 1  Injury with Fall? 0 0 - 0 Yes  Risk for fall due to : Orthopedic patient No Fall Risks No Fall Risks - -  Follow up Falls prevention discussed Falls evaluation completed Falls evaluation completed Falls evaluation completed -    FALL RISK PREVENTION PERTAINING TO THE HOME:  Any stairs in or around the home? No  If so, are there any without handrails? No  Home free of loose throw rugs in walkways, pet beds, electrical cords, etc? Yes  Adequate lighting in your home to reduce risk of falls? Yes   ASSISTIVE DEVICES UTILIZED TO PREVENT FALLS:  Life alert? No  Use of a cane, walker or w/c? No  Grab bars in the bathroom? Yes  Shower chair or bench in shower? No  Elevated toilet seat or a handicapped toilet? No   TIMED UP AND GO:  Was the test performed? No . Telephonic visit.   Cognitive Function: Normal cognitive status assessed by direct observation by this Nurse Health Advisor. No abnormalities found.          Immunizations Immunization History  Administered Date(s) Administered  . Fluad Quad(high Dose 65+) 07/30/2020  . Influenza,inj,Quad PF,6+ Mos 08/09/2016, 08/20/2017  . Influenza-Unspecified 07/23/2014, 09/28/2019  . PFIZER(Purple Top)SARS-COV-2 Vaccination 12/05/2019, 12/30/2019, 10/05/2020  . Pneumococcal Conjugate-13 05/12/2020  . Pneumococcal Polysaccharide-23 08/20/2017  . Tdap 06/24/2009, 06/09/2016    TDAP status: Up to date  Flu Vaccine status: Up to date  Pneumococcal vaccine status: Up to date  Covid-19 vaccine status: Completed vaccines  Qualifies for Shingles Vaccine? Yes    Zostavax completed No   Shingrix Completed?: No.    Education has been provided regarding the importance of this vaccine. Patient has been advised to call insurance company to determine out of pocket expense if they have not yet received this vaccine. Advised may also receive vaccine at local pharmacy or Health Dept.  Verbalized acceptance and understanding.  Screening Tests Health Maintenance  Topic Date Due  . INFLUENZA VACCINE  05/23/2021  . PNA vac Low Risk Adult (2 of 2 - PPSV23) 08/20/2022  . TETANUS/TDAP  06/09/2026  . COLONOSCOPY (Pts 45-44yrs Insurance coverage will need to be confirmed)  09/07/2027  . COVID-19 Vaccine  Completed  . Hepatitis C Screening  Addressed  . HPV VACCINES  Aged Out    Health Maintenance  There are no preventive care reminders to display for this patient.  Colorectal cancer screening: Type of screening: Colonoscopy. Completed 09/06/20. Repeat every 7 years  Lung Cancer Screening: (Low Dose CT Chest recommended if Age 20-80 years, 30 pack-year currently smoking OR have quit w/in 15years.) does not qualify.   Additional Screening:  Hepatitis C Screening: does qualify; Completed 11/24/14  Vision Screening: Recommended annual ophthalmology exams for early detection of glaucoma and other disorders of the eye. Is the patient up to date with their annual eye exam?  No  Who is the provider or what is the name of the office in which the patient attends annual eye exams? Note established If pt is not established with a provider, would they like to be referred to a provider to establish care? Yes . Referral sent to [facility] today.   Dental Screening: Recommended annual dental exams for proper oral hygiene  Community Resource Referral / Chronic Care Management: CRR required this visit?  No   CCM required this visit?  No      Plan:     I have personally reviewed and noted the following in the patient's chart:   . Medical and social  history . Use of alcohol, tobacco or illicit drugs  . Current medications and supplements . Functional ability and status . Nutritional status . Physical activity . Advanced directives . List of other physicians . Hospitalizations, surgeries, and ER visits in previous 12 months . Vitals . Screenings to include cognitive, depression, and falls . Referrals and appointments  In addition, I have reviewed and discussed with patient certain preventive protocols, quality metrics, and best practice recommendations. A written personalized care plan for preventive services as well as general preventive health recommendations were provided to patient.     [name], LPN   01/24/101   Nurse Notes: pt c/o bilateral knee pain; seeing [facility] & scheduled for follow up appt in [place]. Pt states he has received cortisone injections & gel injections with little relief; new provider to discuss stem cell injections.Pt does use CBD oil and arthritis strength tylenol and alternates ibuprofen and meloxicam for pain management.

## 2021-01-26 DIAGNOSIS — M170 Bilateral primary osteoarthritis of knee: Secondary | ICD-10-CM | POA: Diagnosis not present

## 2021-05-26 DIAGNOSIS — M170 Bilateral primary osteoarthritis of knee: Secondary | ICD-10-CM | POA: Diagnosis not present

## 2021-05-26 DIAGNOSIS — M25562 Pain in left knee: Secondary | ICD-10-CM | POA: Diagnosis not present

## 2021-05-26 DIAGNOSIS — M25561 Pain in right knee: Secondary | ICD-10-CM | POA: Diagnosis not present

## 2021-05-26 DIAGNOSIS — M25462 Effusion, left knee: Secondary | ICD-10-CM | POA: Diagnosis not present

## 2021-06-01 ENCOUNTER — Other Ambulatory Visit: Payer: Self-pay | Admitting: Internal Medicine

## 2021-06-01 DIAGNOSIS — K219 Gastro-esophageal reflux disease without esophagitis: Secondary | ICD-10-CM

## 2021-06-01 NOTE — Telephone Encounter (Signed)
  Patient has a follow up appt on 08/01/2021 Should have enough until that appt Review for future refills   Requested Prescriptions  Pending Prescriptions Disp Refills   esomeprazole (NEXIUM) 40 MG capsule [Pharmacy Med Name: ESOMEPRAZOLE MAGNESIUM '40MG'$  DR CAPS] 90 capsule 3    Sig: TAKE 1 CAPSULE(40 MG) BY MOUTH DAILY      Gastroenterology: Proton Pump Inhibitors Passed - 06/01/2021 11:49 AM      Passed - Valid encounter within last 12 months    Recent Outpatient Visits           10 months ago Annual physical exam   [facility] Glean Hess, MD   1 year ago Need for vaccination for pneumococcus   [facility] Glean Hess, MD   2 years ago Annual physical exam   [facility] Glean Hess, MD   3 years ago Need for influenza vaccination   [facility] Glean Hess, MD   4 years ago Annual physical exam   [facility] Glean Hess, MD       Future Appointments             In 2 months Army Melia Jesse Sans, MD [facility]

## 2021-06-02 ENCOUNTER — Other Ambulatory Visit: Payer: Self-pay | Admitting: Internal Medicine

## 2021-06-02 DIAGNOSIS — K219 Gastro-esophageal reflux disease without esophagitis: Secondary | ICD-10-CM

## 2021-06-02 DIAGNOSIS — M25561 Pain in right knee: Secondary | ICD-10-CM | POA: Diagnosis not present

## 2021-06-02 DIAGNOSIS — M25562 Pain in left knee: Secondary | ICD-10-CM | POA: Diagnosis not present

## 2021-06-02 DIAGNOSIS — M1712 Unilateral primary osteoarthritis, left knee: Secondary | ICD-10-CM | POA: Diagnosis not present

## 2021-06-02 DIAGNOSIS — M170 Bilateral primary osteoarthritis of knee: Secondary | ICD-10-CM | POA: Diagnosis not present

## 2021-06-02 NOTE — Telephone Encounter (Signed)
Future visit in 2 months  

## 2021-06-02 NOTE — Telephone Encounter (Signed)
[place] called and spoke to [place], [facility] about the refill(s) Esomeprazole requested. Advised it was sent on 08/19/20 #90/3 refill(s). He says there are 2 more refills left on it and the system kicked it out, so he will add those back and get it ready for the patient to pick up.

## 2021-06-02 NOTE — Telephone Encounter (Signed)
Copied from [place] [phone]. Topic: Quick Communication - Rx Refill/Question >> Jun 02, 2021 11:04 AM Yvette Rack wrote: Medication: esomeprazole (NEXIUM) 40 MG capsule  Has the patient contacted their pharmacy? Yes.   (Agent: If no, request that the patient contact the pharmacy for the refill.) (Agent: If yes, when and what did the pharmacy advise?)  Preferred Pharmacy (with phone number or street name): [id number], [address] AT [place] Phone: [phone]  Fax: [phone]  Agent: Please be advised that RX refills may take up to 3 business days. We ask that you follow-up with your pharmacy.

## 2021-06-09 DIAGNOSIS — M25562 Pain in left knee: Secondary | ICD-10-CM | POA: Diagnosis not present

## 2021-06-09 DIAGNOSIS — M170 Bilateral primary osteoarthritis of knee: Secondary | ICD-10-CM | POA: Diagnosis not present

## 2021-06-09 DIAGNOSIS — M25561 Pain in right knee: Secondary | ICD-10-CM | POA: Diagnosis not present

## 2021-08-01 ENCOUNTER — Ambulatory Visit (INDEPENDENT_AMBULATORY_CARE_PROVIDER_SITE_OTHER): Payer: Medicare Other | Admitting: Internal Medicine

## 2021-08-01 ENCOUNTER — Encounter: Payer: Self-pay | Admitting: Internal Medicine

## 2021-08-01 ENCOUNTER — Other Ambulatory Visit: Payer: Self-pay

## 2021-08-01 ENCOUNTER — Other Ambulatory Visit: Payer: Self-pay | Admitting: Internal Medicine

## 2021-08-01 VITALS — BP 118/80 | HR 66 | Temp 98.1°F | Ht 71.0 in | Wt 260.0 lb

## 2021-08-01 DIAGNOSIS — Z125 Encounter for screening for malignant neoplasm of prostate: Secondary | ICD-10-CM

## 2021-08-01 DIAGNOSIS — Z23 Encounter for immunization: Secondary | ICD-10-CM | POA: Diagnosis not present

## 2021-08-01 DIAGNOSIS — Z0000 Encounter for general adult medical examination without abnormal findings: Secondary | ICD-10-CM

## 2021-08-01 DIAGNOSIS — R7303 Prediabetes: Secondary | ICD-10-CM | POA: Diagnosis not present

## 2021-08-01 DIAGNOSIS — M170 Bilateral primary osteoarthritis of knee: Secondary | ICD-10-CM

## 2021-08-01 DIAGNOSIS — K219 Gastro-esophageal reflux disease without esophagitis: Secondary | ICD-10-CM

## 2021-08-01 DIAGNOSIS — J4520 Mild intermittent asthma, uncomplicated: Secondary | ICD-10-CM

## 2021-08-01 DIAGNOSIS — D485 Neoplasm of uncertain behavior of skin: Secondary | ICD-10-CM

## 2021-08-01 DIAGNOSIS — N529 Male erectile dysfunction, unspecified: Secondary | ICD-10-CM

## 2021-08-01 DIAGNOSIS — Z6835 Body mass index (BMI) 35.0-35.9, adult: Secondary | ICD-10-CM

## 2021-08-01 MED ORDER — SILDENAFIL CITRATE 20 MG PO TABS: 60.0000 mg | ORAL_TABLET | Freq: Every day | ORAL | 0 refills | Status: DC | PRN

## 2021-08-01 MED ORDER — ALBUTEROL SULFATE HFA 108 (90 BASE) MCG/ACT IN AERS: 2.0000 | INHALATION_SPRAY | Freq: Four times a day (QID) | RESPIRATORY_TRACT | 0 refills | Status: DC | PRN

## 2021-08-01 NOTE — Progress Notes (Signed)
Date:  08/01/2021   Name:  Derek Grant   DOB:  Jan 18, 1956   MRN:  [id number]   Chief Complaint: Annual Exam, Flu Vaccine, Referral (Told her had a spot on lungs when he was diagnosed with asthma wants to see if he could see someone to make sure it is not worse. Was told the spot was due to previous smoking habits), and Nasal Congestion (X1.5 months, Has yellow mucous at times, no fever ) Derek Grant is a 65 y.o. male who presents today for his Complete Annual Exam. He feels well. 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 He reports he is sleeping well.   Colonoscopy: 08/2020  Immunization History  Administered Date(s) Administered   Fluad Quad(high Dose 65+) 07/30/2020   Influenza,inj,Quad PF,6+ Mos 08/09/2016, 08/20/2017   Influenza-Unspecified 07/23/2014, 09/28/2019   PFIZER(Purple Top)SARS-COV-2 Vaccination 12/05/2019, 12/30/2019, 10/05/2020   Pneumococcal Conjugate-13 05/12/2020   Pneumococcal Polysaccharide-23 08/20/2017   Tdap 06/24/2009, 06/09/2016    Gastroesophageal Reflux He complains of heartburn and wheezing. He reports no abdominal pain, no chest pain or no choking. This is a recurrent problem. The problem occurs rarely. The heartburn does not wake him from sleep. The heartburn does not limit his activity. The heartburn doesn't change with position. Pertinent negatives include no fatigue. He has tried a PPI for the symptoms. The treatment provided significant relief.  Asthma He complains of shortness of breath and wheezing. This is a recurrent problem. The problem has been unchanged. Associated symptoms include heartburn. Pertinent negatives include no appetite change, chest pain, headaches, myalgias or trouble swallowing. His symptoms are not alleviated by beta-agonist and steroid inhaler. His past medical history is significant for asthma.   Lab Results  Component Value Date   CREATININE 1.15 07/30/2020   BUN 19 07/30/2020   NA 137  07/30/2020   K 4.5 07/30/2020   CL 103 07/30/2020   CO2 24 07/30/2020   Lab Results  Component Value Date   CHOL 155 07/30/2020   HDL 53 07/30/2020   LDLCALC 92 07/30/2020   TRIG 48 07/30/2020   CHOLHDL 2.9 07/30/2020   No results found for: TSH No results found for: HGBA1C Lab Results  Component Value Date   WBC 3.8 07/30/2020   HGB 14.0 07/30/2020   HCT 43.7 07/30/2020   MCV 88 07/30/2020   PLT 248 07/30/2020   Lab Results  Component Value Date   ALT 23 07/30/2020   AST 20 07/30/2020   ALKPHOS 47 07/30/2020   BILITOT 0.3 07/30/2020     Review of Systems  Constitutional:  Negative for appetite change, chills, diaphoresis, fatigue and unexpected weight change.  HENT:  Negative for hearing loss, tinnitus, trouble swallowing and voice change.   Eyes:  Negative for visual disturbance.  Respiratory:  Positive for shortness of breath and wheezing. Negative for choking.   Cardiovascular:  Negative for chest pain, palpitations and leg swelling.  Gastrointestinal:  Positive for heartburn. Negative for abdominal pain, blood in stool, constipation and diarrhea.  Genitourinary:  Negative for difficulty urinating, dysuria and frequency.       Nocturia x 2-3  Musculoskeletal:  Positive for gait problem. Negative for back pain and myalgias. Arthralgias: knee pain. Skin:  Negative for color change and rash.       Several skin lesions - cyst on back, scaly lesion on left leg x 10 yrs, pruritic area on left upper arm  Neurological:  Negative for dizziness, syncope and headaches.  Hematological:  Negative for adenopathy.  Psychiatric/Behavioral:  Negative for dysphoric mood and sleep disturbance. The patient is not nervous/anxious.    Patient Active Problem List   Diagnosis Date Noted   Personal history of colonic polyps    Rectal polyp    BMI 35.0-35.9,adult 08/30/2020   Asthma, well controlled 07/30/2020   Osteoarthritis of knee 11/04/2019   Patellofemoral stress syndrome  11/04/2019   Decreased hearing of both ears 08/09/2016   GERD (gastroesophageal reflux disease) 04/03/2016   Adenomatous colon polyp 03/02/2015   ED (erectile dysfunction) of organic origin 06/24/2014    No Known Allergies  Past Surgical History:  Procedure Laterality Date   COLONOSCOPY  01/2015   adenomatous polyps   COLONOSCOPY WITH PROPOFOL N/A 09/06/2020   Procedure: COLONOSCOPY WITH PROPOFOL;  Surgeon: Lucilla Lame, MD;  Location: [place];  Service: Endoscopy;  Laterality: N/A;  priority 4   POLYPECTOMY  09/06/2020   Procedure: POLYPECTOMY;  Surgeon: Lucilla Lame, MD;  Location: [place];  Service: Endoscopy;;    Social History   Tobacco Use   Smoking status: Former    Types: Cigarettes    Quit date: 06/25/1999    Years since quitting: 22.1   Smokeless tobacco: Never  Vaping Use   Vaping Use: Never used  Substance Use Topics   Alcohol use: No    Alcohol/week: 0.0 standard drinks   Drug use: No     Medication list has been reviewed and updated.  Current Meds  Medication Sig   acetaminophen (TYLENOL) 650 MG CR tablet Take 1,300 mg by mouth every 8 (eight) hours as needed for pain.   esomeprazole (NEXIUM) 40 MG capsule TAKE 1 CAPSULE(40 MG) BY MOUTH DAILY   fluticasone furoate-vilanterol (BREO ELLIPTA) 100-25 MCG/INH AEPB Inhale 1 puff into the lungs daily.   ibuprofen (ADVIL) 200 MG tablet Take 200 mg by mouth every 6 (six) hours as needed. Pt taking up to 600 mg PRN; pt aware not to take when taking meloxicam   meloxicam (MOBIC) 15 MG tablet Take 15 mg by mouth as needed.   sildenafil (REVATIO) 20 MG tablet Take 3 tablets (60 mg total) by mouth daily as needed.    PHQ 2/9 Scores 01/24/2021 07/30/2020 05/12/2020 06/02/2019  PHQ - 2 Score 0 0 0 0  PHQ- 9 Score - 0 0 -    GAD 7 : Generalized Anxiety Score 07/30/2020 05/12/2020  Nervous, Anxious, on Edge 0 0  Control/stop worrying 0 0  Worry too much - different things 0 0  Trouble relaxing 0 0   Restless 0 0  Easily annoyed or irritable 0 0  Afraid - awful might happen 0 0  Total GAD 7 Score 0 0  Anxiety Difficulty Not difficult at all Not difficult at all    BP Readings from Last 3 Encounters:  08/01/21 118/80  09/06/20 110/80  07/30/20 118/70    Physical Exam Vitals and nursing note reviewed.  Constitutional:      Appearance: Normal appearance. He is well-developed.  HENT:     Head: Normocephalic.     Right Ear: Tympanic membrane, ear canal and external ear normal.     Left Ear: Tympanic membrane, ear canal and external ear normal.     Nose: Nose normal.  Eyes:     Conjunctiva/sclera: Conjunctivae normal.     Pupils: Pupils are equal, round, and reactive to light.  Neck:     Thyroid: No thyromegaly.     Vascular: No carotid  bruit.  Cardiovascular:     Rate and Rhythm: Normal rate and regular rhythm.     Heart sounds: Normal heart sounds.  Pulmonary:     Effort: Pulmonary effort is normal.     Breath sounds: Normal breath sounds. No wheezing.  Chest:  Breasts:    Right: No mass.     Left: No mass.  Abdominal:     General: Bowel sounds are normal.     Palpations: Abdomen is soft.     Tenderness: There is no abdominal tenderness.  Musculoskeletal:        General: Normal range of motion.     Cervical back: Normal range of motion and neck supple.  Lymphadenopathy:     Cervical: No cervical adenopathy.  Skin:    General: Skin is warm and dry.     Capillary Refill: Capillary refill takes less than 2 seconds.          Comments: Scaly lesion left lower leg Globus cyst left second toe Non specific dry skin near left axilla 1 cm cystic mobile non tender mass mid back   Neurological:     General: No focal deficit present.     Mental Status: He is alert and oriented to person, place, and time.     Deep Tendon Reflexes: Reflexes are normal and symmetric.  Psychiatric:        Attention and Perception: Attention normal.        Mood and Affect: Mood normal.         Thought Content: Thought content normal.    Wt Readings from Last 3 Encounters:  08/01/21 260 lb (117.9 kg)  01/24/21 250 lb (113.4 kg)  09/06/20 245 lb (111.1 kg)    BP 118/80   Pulse 66   Temp 98.1 F (36.7 C) (Oral)   Ht 5\' 11"  (1.803 m)   Wt 260 lb (117.9 kg)   SpO2 96%   BMI 36.26 kg/m   Assessment and Plan: 1. Annual physical exam Exam is normal except for weight. Encourage regular exercise and appropriate dietary changes. Up to date on immunizations and screenings - Comprehensive metabolic panel - Lipid panel  2. Prostate cancer screening - PSA  3. Gastroesophageal reflux disease without esophagitis Symptoms well controlled on daily PPI No red flag signs such as weight loss, n/v, melena Will continue nexium. - CBC with Differential/Platelet  4. BMI 35.0-35.9,adult - Hemoglobin A1c  5. Asthma, mild intermittent, well-controlled Recommend use of albuterol PRN wheezing Continue Breo daily Refer to Pulmonary for concerns of previous lung nodule - Ambulatory referral to Pulmonology - albuterol (VENTOLIN HFA) 108 (90 Base) MCG/ACT inhaler; Inhale 2 puffs into the lungs every 6 (six) hours as needed for wheezing or shortness of breath.  Dispense: 1 each; Refill: 0  6. Primary osteoarthritis of both knees Improved after knee injections from [facility]  7. Neoplasm of uncertain behavior of skin - Ambulatory referral to Dermatology  8. ED (erectile dysfunction) of organic origin - sildenafil (REVATIO) 20 MG tablet; Take 3 tablets (60 mg total) by mouth daily as needed.  Dispense: 50 tablet; Refill: 0   Partially dictated using Editor, commissioning. Any errors are unintentional.  Halina Maidens, MD [facility]  08/01/2021

## 2021-08-02 ENCOUNTER — Encounter: Payer: Self-pay | Admitting: Internal Medicine

## 2021-08-02 DIAGNOSIS — E782 Mixed hyperlipidemia: Secondary | ICD-10-CM | POA: Insufficient documentation

## 2021-08-02 DIAGNOSIS — R7303 Prediabetes: Secondary | ICD-10-CM | POA: Insufficient documentation

## 2021-08-02 LAB — CBC WITH DIFFERENTIAL/PLATELET
Basophils Absolute: 0 10*3/uL (ref 0.0–0.2)
Basos: 1 %
EOS (ABSOLUTE): 0.6 10*3/uL — ABNORMAL HIGH (ref 0.0–0.4)
Eos: 12 %
Hematocrit: 42.9 % (ref 37.5–51.0)
Hemoglobin: 14.6 g/dL (ref 13.0–17.7)
Immature Grans (Abs): 0 10*3/uL (ref 0.0–0.1)
Immature Granulocytes: 0 %
Lymphocytes Absolute: 1.5 10*3/uL (ref 0.7–3.1)
Lymphs: 31 %
MCH: 29.7 pg (ref 26.6–33.0)
MCHC: 34 g/dL (ref 31.5–35.7)
MCV: 87 fL (ref 79–97)
Monocytes Absolute: 0.3 10*3/uL (ref 0.1–0.9)
Monocytes: 7 %
Neutrophils Absolute: 2.3 10*3/uL (ref 1.4–7.0)
Neutrophils: 49 %
Platelets: 299 10*3/uL (ref 150–450)
RBC: 4.91 x10E6/uL (ref 4.14–5.80)
RDW: 13.6 % (ref 11.6–15.4)
WBC: 4.7 10*3/uL (ref 3.4–10.8)

## 2021-08-02 LAB — PSA: Prostate Specific Ag, Serum: 2.9 ng/mL (ref 0.0–4.0)

## 2021-08-02 LAB — COMPREHENSIVE METABOLIC PANEL
ALT: 17 IU/L (ref 0–44)
AST: 18 IU/L (ref 0–40)
Albumin/Globulin Ratio: 1.7 (ref 1.2–2.2)
Albumin: 4 g/dL (ref 3.8–4.8)
Alkaline Phosphatase: 59 IU/L (ref 44–121)
BUN/Creatinine Ratio: 12 (ref 10–24)
BUN: 14 mg/dL (ref 8–27)
Bilirubin Total: <0.2 mg/dL (ref 0.0–1.2)
CO2: 22 mmol/L (ref 20–29)
Calcium: 9.1 mg/dL (ref 8.6–10.2)
Chloride: 104 mmol/L (ref 96–106)
Creatinine, Ser: 1.13 mg/dL (ref 0.76–1.27)
Globulin, Total: 2.3 g/dL (ref 1.5–4.5)
Glucose: 99 mg/dL (ref 70–99)
Potassium: 4.8 mmol/L (ref 3.5–5.2)
Sodium: 140 mmol/L (ref 134–144)
Total Protein: 6.3 g/dL (ref 6.0–8.5)
eGFR: 72 mL/min/{1.73_m2} (ref >59)

## 2021-08-02 LAB — LIPID PANEL
Chol/HDL Ratio: 3.1 ratio (ref 0.0–5.0)
Cholesterol, Total: 158 mg/dL (ref 100–199)
HDL: 51 mg/dL (ref >39)
LDL Chol Calc (NIH): 66 mg/dL (ref 0–99)
Triglycerides: 253 mg/dL — ABNORMAL HIGH (ref 0–149)
VLDL Cholesterol Cal: 41 mg/dL — ABNORMAL HIGH (ref 5–40)

## 2021-08-02 LAB — HEMOGLOBIN A1C
Est. average glucose Bld gHb Est-mCnc: 120 mg/dL
Hgb A1c MFr Bld: 5.8 % — ABNORMAL HIGH (ref 4.8–5.6)

## 2021-08-02 NOTE — Progress Notes (Signed)
Called pt left VM to call back. ? ?[facility] nurse may give results to patient if they return call to clinic, a CRM has been created. ? ?KP

## 2021-08-19 DIAGNOSIS — L281 Prurigo nodularis: Secondary | ICD-10-CM | POA: Diagnosis not present

## 2021-08-19 DIAGNOSIS — B353 Tinea pedis: Secondary | ICD-10-CM | POA: Diagnosis not present

## 2021-08-19 DIAGNOSIS — L821 Other seborrheic keratosis: Secondary | ICD-10-CM | POA: Diagnosis not present

## 2021-08-19 DIAGNOSIS — B351 Tinea unguium: Secondary | ICD-10-CM | POA: Diagnosis not present

## 2021-08-19 DIAGNOSIS — D170 Benign lipomatous neoplasm of skin and subcutaneous tissue of head, face and neck: Secondary | ICD-10-CM | POA: Diagnosis not present

## 2021-08-19 DIAGNOSIS — L249 Irritant contact dermatitis, unspecified cause: Secondary | ICD-10-CM | POA: Diagnosis not present

## 2021-09-05 ENCOUNTER — Other Ambulatory Visit: Payer: Self-pay | Admitting: Internal Medicine

## 2021-09-05 DIAGNOSIS — J4520 Mild intermittent asthma, uncomplicated: Secondary | ICD-10-CM

## 2021-09-05 NOTE — Telephone Encounter (Signed)
Requested medication (s) are due for refill today: Yes  Requested medication (s) are on the active medication list: Yes  Last refill:  07/30/20  Future visit scheduled: Yes  Notes to clinic:  Prescription expired.    Requested Prescriptions  Pending Prescriptions Disp Refills   BREO ELLIPTA 100-25 MCG/ACT AEPB [Pharmacy Med Name: BREO ELLIPTA 100-25MCG ORAL INH(30)] 180 each 5    Sig: INHALE 1 PUFF INTO THE LUNGS DAILY     There is no refill protocol information for this order

## 2021-09-22 DIAGNOSIS — M170 Bilateral primary osteoarthritis of knee: Secondary | ICD-10-CM | POA: Diagnosis not present

## 2021-09-26 ENCOUNTER — Institutional Professional Consult (permissible substitution): Payer: Medicare Other | Admitting: Internal Medicine

## 2021-09-27 ENCOUNTER — Institutional Professional Consult (permissible substitution): Payer: Medicare Other | Admitting: Internal Medicine

## 2021-10-19 DIAGNOSIS — M25562 Pain in left knee: Secondary | ICD-10-CM | POA: Diagnosis not present

## 2021-10-19 DIAGNOSIS — M170 Bilateral primary osteoarthritis of knee: Secondary | ICD-10-CM | POA: Diagnosis not present

## 2021-10-19 DIAGNOSIS — M25561 Pain in right knee: Secondary | ICD-10-CM | POA: Diagnosis not present

## 2021-11-15 ENCOUNTER — Institutional Professional Consult (permissible substitution): Payer: Medicare Other | Admitting: Internal Medicine

## 2021-12-22 ENCOUNTER — Ambulatory Visit: Payer: Medicare Other | Admitting: Internal Medicine

## 2021-12-22 ENCOUNTER — Other Ambulatory Visit: Payer: Self-pay

## 2021-12-22 ENCOUNTER — Other Ambulatory Visit
Admission: RE | Admit: 2021-12-22 | Discharge: 2021-12-22 | Disposition: A | Discharge status: Home / Self Care | Payer: Medicare Other | Source: Ambulatory Visit | Attending: Internal Medicine | Admitting: Internal Medicine

## 2021-12-22 ENCOUNTER — Encounter: Payer: Self-pay | Admitting: Internal Medicine

## 2021-12-22 DIAGNOSIS — Z20822 Contact with and (suspected) exposure to covid-19: Secondary | ICD-10-CM | POA: Diagnosis not present

## 2021-12-22 DIAGNOSIS — Z01812 Encounter for preprocedural laboratory examination: Secondary | ICD-10-CM | POA: Insufficient documentation

## 2021-12-22 DIAGNOSIS — J4520 Mild intermittent asthma, uncomplicated: Secondary | ICD-10-CM

## 2021-12-22 DIAGNOSIS — R0609 Other forms of dyspnea: Secondary | ICD-10-CM

## 2021-12-22 LAB — SARS CORONAVIRUS 2 (TAT 6-24 HRS): SARS Coronavirus 2: NEGATIVE

## 2021-12-22 NOTE — Patient Instructions (Addendum)
Nexium 40 mg Take 30-60 min before first meal of the day  ? ?GERD (REFLUX)  is an extremely common cause of respiratory symptoms just like yours , many times with no obvious heartburn at all.  ? ? It can be treated with medication, but also with lifestyle changes including elevation of the head of your bed (ideally with 6-8inch blocks under the headboard of your bed),  Smoking cessation, avoidance of late meals, excessive alcohol, and avoid fatty foods, chocolate, peppermint, colas, red wine, and acidic juices such as orange juice.  ?NO MINT OR MENTHOL PRODUCTS SO NO COUGH DROPS  ?USE SUGARLESS CANDY INSTEAD (Jolley ranchers or Stover's or Life Savers) or even ice chips will also do - the key is to swallow to prevent all throat clearing. ?NO OIL BASED VITAMINS - use powdered substitutes.  Avoid fish oil when coughing.  ? ?For the next two weeks, try blowing BREO out thru nose, then try for one week every other day then every 3rd day  ? ?Only use your albuterol as a rescue medication to be used if you can't catch your breath by resting or doing a relaxed purse lip breathing pattern.  ?- The less you use it, the better it will work when you need it. ?- Ok to use up to 2 puffs  every 4 hours if you must but call for immediate appointment if use goes up over your usual need ?- Don't leave home without it !!  (think of it like the spare tire for your car)  ? ?Ok to try albuterol 15 min before an activity (on alternating days)  that you know would usually make you short of breath and see if it makes any difference and if makes none then don't take albuterol after activity unless you can't catch your breath as this means it's the resting that helps, not the albuterol. ?    ? ? ? ?PFT's in about 6 weeks. ? ? ?Please remember to go to the  x-ray department  for your tests - we will call you with the results when they are available   ? ? ?Please schedule a follow up visit in 3 months but call sooner if needed - bring both  inhalers     ?

## 2021-12-22 NOTE — Progress Notes (Signed)
? ?Derek Grant, male    DOB: [birth year]   MRN: [id number] ? ? ?Brief patient profile:  ?65   yobm  quit smoking 2000 = 180-190 wt  referred to pulmonary clinic in [facility]  12/22/2021 by Dr Army Melia  for asthma onset of symptoms his late 15s  > eval by sev doctors one was specialist in [place] some better but not back to usual self.  ? ? ?History of Present Illness  ?12/22/2021  Pulmonary/ 1st office eval/ Melvyn Novas / [facility]  ?Chief Complaint  ?Patient presents with  ? pulmonary consult  ?  Per Dr. Army Melia- hx of asthma. C/o occ wheezing.   ?Dyspnea:  MMRC1 = can walk nl pace, flat grade, can't hurry or go uphills or steps s sob   ?Cough: none  ?Sleep: able to lie 2 pillow ?SABA use: never uses, can't tell when stops Breo  ? ?No obvious day to day or daytime variability or assoc excess/ purulent sputum or mucus plugs or hemoptysis or cp or chest tightness, subjective wheeze or overt sinus or hb symptoms.  ? ?Sleeping  without nocturnal  or early am exacerbation  of respiratory  c/o's or need for noct saba. Also denies any obvious fluctuation of symptoms with weather or environmental changes or other aggravating or alleviating factors except as outlined above  ? ?No unusual exposure hx or h/o childhood pna/ asthma or knowledge of premature birth. ? ?Current Allergies, Complete Past Medical History, Past Surgical History, Family History, and Social History were reviewed in [employer] record. ? ?ROS  The following are not active complaints unless bolded ?Hoarseness, sore throat, dysphagia, dental problems, itching, sneezing,  nasal congestion or discharge of excess mucus or purulent secretions, ear ache,   fever, chills, sweats, unintended wt loss or wt gain, classically pleuritic or exertional cp,  orthopnea pnd or arm/hand swelling  or leg swelling, presyncope, palpitations, abdominal pain, anorexia, nausea, vomiting, diarrhea  or change in bowel habits or change in bladder  habits, change in stools or change in urine, dysuria, hematuria,  rash, arthralgias, visual complaints, headache, numbness, weakness or ataxia or problems with walking or coordination,  change in mood or  memory. ?      ?   ? ?Past Medical History:  ?Diagnosis Date  ? Arthritis   ? Asthma   ? GERD (gastroesophageal reflux disease)   ? ? ?Outpatient Medications Prior to Visit  ?Medication Sig Dispense Refill  ? acetaminophen (TYLENOL) 650 MG CR tablet Take 1,300 mg by mouth every 8 (eight) hours as needed for pain.    ? albuterol (VENTOLIN HFA) 108 (90 Base) MCG/ACT inhaler INHALE 2 PUFFS INTO THE LUNGS EVERY 6 HOURS AS NEEDED FOR WHEEZING OR SHORTNESS OF BREATH 20.1 g 0  ? BREO ELLIPTA 100-25 MCG/ACT AEPB INHALE 1 PUFF INTO THE LUNGS DAILY 180 each 5  ? esomeprazole (NEXIUM) 40 MG capsule TAKE 1 CAPSULE(40 MG) BY MOUTH DAILY 90 capsule 0  ? ibuprofen (ADVIL) 200 MG tablet Take 200 mg by mouth every 6 (six) hours as needed. Pt taking up to 600 mg PRN; pt aware not to take when taking meloxicam    ? sildenafil (REVATIO) 20 MG tablet Take 3 tablets (60 mg total) by mouth daily as needed. 50 tablet 0  ? ?No facility-administered medications prior to visit.  ? ? ? ?Objective:  ?  ? ?BP 124/62 (BP Location: Left Arm, Cuff Size: Large)   Pulse 65  Temp 97.9 ?F (36.6 ?C) (Temporal)   Ht 5\' 11"  (1.803 m)   Wt 251 lb 12.8 oz (114.2 kg)   SpO2 97%   BMI 35.12 kg/m?  ? ?SpO2: 97 % ? ?Amb bm nad/ slt hoarse bm  prominent pseudowheeze  ? ? ? HEENT : pt wearing mask not removed for exam due to covid -19 concerns.  ? ? ?NECK :  without JVD/Nodes/TM/ nl carotid upstrokes bilaterally ? ? ?LUNGS: no acc muscle use,  Nl contour chest which is clear to A and P bilaterally without cough on insp or exp maneuvers ? ? ?CV:  RRR  no s3 or murmur or increase in P2, and no edema  ? ?ABD:  soft and nontender with nl inspiratory excursion in the supine position. No bruits or organomegaly appreciated, bowel sounds nl ? ?MS:  Nl gait/ ext  warm without deformities, calf tenderness, cyanosis or clubbing ?No obvious joint restrictions  ? ?SKIN: warm and dry without lesions   ? ?NEURO:  alert, approp, nl sensorium with  no motor or cerebellar deficits apparent.  ? ? ?CXR PA and Lateral:   12/22/2021 :    ?I personally reviewed images and impression is as follows:     ?Wnl  ? ? ? ?   ?Assessment  ? ?DOE (dyspnea on exertion) ?Stopped smoking 2000 at wt 180-190   ?- PFT's  12/23/2021   FEV1 3.34 (108 % ) ratio 0.82  p 21 % improvement from saba p Breo prior to study with DLCO  24.32 (89%)  With a classic VCD pattern f/v loop  ?- ? Better on Breo ? > try max gerd rx and   wean off as of 12/22/2021 @ wt 251  Due to pseudowheeze on exam  and see what if any symptoms flares and if so try change to symbicort 160 2bid  ? ? ?When respiratory symptoms begin or become refractory well after a patient reports complete smoking cessation,  Especially when this wasn't the case while they were smoking, a red flag is raised based on the work of Dr Kris Mouton which states: if you quit smoking when your best day FEV1 is still well preserved it is highly unlikely you will progress to severe disease. ? ?That is to say, once the smoking stops,  the symptoms should not suddenly erupt or markedly worsen.  If so, the differential diagnosis should include  obesity/deconditioning,  LPR/Reflux/Aspiration syndromes,  occult CHF, or  especially side effect of medications commonly used in this population, which can include inhalers themselves which can cause cough and pseuowheeze involving the upper airway > rec rx as above ? ?- The proper method of use, as well as anticipated side effects, of a metered-dose inhaler were discussed and demonstrated to the patient using teach back method  ? ?  ? ? ?Each maintenance medication was reviewed in detail including emphasizing most importantly the difference between maintenance and prns and under what circumstances the prns are to be triggered using an  action plan format where appropriate. ? ?Total time for H and P, chart review, counseling, reviewing hfa device(s) , directly observing portions of ambulatory 02 saturation study/ and generating customized AVS unique to this office visit / same day charting  > 45 min  ?  ?      ? ? ? ? ?Christinia Gully, MD ?12/22/2021 ?   ?

## 2021-12-23 ENCOUNTER — Ambulatory Visit (HOSPITAL_BASED_OUTPATIENT_CLINIC_OR_DEPARTMENT_OTHER): Payer: Medicare Other

## 2021-12-23 ENCOUNTER — Other Ambulatory Visit: Payer: Self-pay

## 2021-12-23 ENCOUNTER — Encounter: Payer: Self-pay | Admitting: Internal Medicine

## 2021-12-23 ENCOUNTER — Ambulatory Visit
Admission: RE | Admit: 2021-12-23 | Discharge: 2021-12-23 | Disposition: A | Discharge status: Home / Self Care | Payer: Medicare Other | Source: Ambulatory Visit | Attending: Internal Medicine | Admitting: Internal Medicine

## 2021-12-23 DIAGNOSIS — N529 Male erectile dysfunction, unspecified: Secondary | ICD-10-CM

## 2021-12-23 DIAGNOSIS — R0600 Dyspnea, unspecified: Secondary | ICD-10-CM | POA: Diagnosis not present

## 2021-12-23 DIAGNOSIS — J4520 Mild intermittent asthma, uncomplicated: Secondary | ICD-10-CM | POA: Diagnosis not present

## 2021-12-23 DIAGNOSIS — R0609 Other forms of dyspnea: Secondary | ICD-10-CM | POA: Insufficient documentation

## 2021-12-23 LAB — PULMONARY FUNCTION TEST ARMC ONLY
DL/VA % pred: 102 %
DL/VA: 4.18 ml/min/mmHg/L
DLCO unc % pred: 89 %
DLCO unc: 24.32 ml/min/mmHg
FEF 25-75 Post: 4.59 L/sec
FEF 25-75 Pre: 1.97 L/sec
FEF2575-%Change-Post: 132 %
FEF2575-%Pred-Post: 169 %
FEF2575-%Pred-Pre: 72 %
FEV1-%Change-Post: 21 %
FEV1-%Pred-Post: 108 %
FEV1-%Pred-Pre: 88 %
FEV1-Post: 3.34 L
FEV1-Pre: 2.74 L
FEV1FVC-%Change-Post: 11 %
FEV1FVC-%Pred-Pre: 96 %
FEV6-%Change-Post: 9 %
FEV6-%Pred-Post: 103 %
FEV6-%Pred-Pre: 94 %
FEV6-Post: 3.99 L
FEV6-Pre: 3.66 L
FEV6FVC-%Change-Post: 0 %
FEV6FVC-%Pred-Post: 102 %
FEV6FVC-%Pred-Pre: 103 %
FVC-%Change-Post: 9 %
FVC-%Pred-Post: 100 %
FVC-%Pred-Pre: 91 %
FVC-Post: 4.05 L
FVC-Pre: 3.69 L
Post FEV1/FVC ratio: 82 %
Post FEV6/FVC ratio: 99 %
Pre FEV1/FVC ratio: 74 %
Pre FEV6/FVC Ratio: 99 %
RV % pred: 53 %
RV: 1.31 L
TLC % pred: 66 %
TLC: 4.8 L

## 2021-12-23 MED ORDER — SILDENAFIL CITRATE 20 MG PO TABS: 60.0000 mg | ORAL_TABLET | Freq: Every day | ORAL | 0 refills | Status: DC | PRN

## 2021-12-23 MED ORDER — ALBUTEROL SULFATE (2.5 MG/3ML) 0.083% IN NEBU
2.5000 mg | INHALATION_SOLUTION | Freq: Once | RESPIRATORY_TRACT | Status: AC
  Administered 2021-12-23: 2.5 mg via RESPIRATORY_TRACT
  Filled 2021-12-23: qty 3

## 2021-12-23 NOTE — Assessment & Plan Note (Addendum)
Stopped smoking 2000 at wt 180-190   ?- PFT's  12/23/2021   FEV1 3.34 (108 % ) ratio 0.82  p 21 % improvement from saba p Breo prior to study with DLCO  24.32 (89%)  With a classic VCD pattern f/v loop  ?- ? Better on Breo ? > try max gerd rx and   wean off as of 12/22/2021 @ wt 251  Due to pseudowheeze on exam  and see what if any symptoms flares and if so try change to symbicort 160 2bid  ? ? ?When respiratory symptoms begin or become refractory well after a patient reports complete smoking cessation,  Especially when this wasn't the case while they were smoking, a red flag is raised based on the work of Dr Kris Mouton which states: if you quit smoking when your best day FEV1 is still well preserved it is highly unlikely you will progress to severe disease. ? ?That is to say, once the smoking stops,  the symptoms should not suddenly erupt or markedly worsen.  If so, the differential diagnosis should include  obesity/deconditioning,  LPR/Reflux/Aspiration syndromes,  occult CHF, or  especially side effect of medications commonly used in this population, which can include inhalers themselves which can cause cough and pseuowheeze involving the upper airway > rec rx as above ? ?- The proper method of use, as well as anticipated side effects, of a metered-dose inhaler were discussed and demonstrated to the patient using teach back method  ? ?  ? ? ?Each maintenance medication was reviewed in detail including emphasizing most importantly the difference between maintenance and prns and under what circumstances the prns are to be triggered using an action plan format where appropriate. ? ?Total time for H and P, chart review, counseling, reviewing hfa device(s) , directly observing portions of ambulatory 02 saturation study/ and generating customized AVS unique to this office visit / same day charting  > 45 min  ?  ?      ?

## 2021-12-27 ENCOUNTER — Other Ambulatory Visit: Payer: Self-pay

## 2021-12-27 MED ORDER — BUDESONIDE-FORMOTEROL FUMARATE 160-4.5 MCG/ACT IN AERO: 2.0000 | INHALATION_SPRAY | Freq: Two times a day (BID) | RESPIRATORY_TRACT | 12 refills | Status: DC

## 2022-01-25 ENCOUNTER — Ambulatory Visit: Payer: Medicare HMO

## 2022-02-01 ENCOUNTER — Ambulatory Visit: Payer: Medicare Other

## 2022-02-01 DIAGNOSIS — M25561 Pain in right knee: Secondary | ICD-10-CM | POA: Diagnosis not present

## 2022-02-01 DIAGNOSIS — M170 Bilateral primary osteoarthritis of knee: Secondary | ICD-10-CM | POA: Diagnosis not present

## 2022-02-08 ENCOUNTER — Ambulatory Visit (INDEPENDENT_AMBULATORY_CARE_PROVIDER_SITE_OTHER): Payer: Medicare Other

## 2022-02-08 DIAGNOSIS — Z0000 Encounter for general adult medical examination without abnormal findings: Secondary | ICD-10-CM | POA: Diagnosis not present

## 2022-02-08 NOTE — Progress Notes (Signed)
? ?Subjective:  ? Derek Grant is a 66 y.o. male who presents for Medicare Annual/Subsequent preventive examination. ? ?Virtual Visit via Telephone Note ? ?I connected with  [name] on 02/08/22 at  3:15 PM EDT by telephone and verified that I am speaking with the correct person using two identifiers. ? ?Location: ?Patient: home ?Provider: [facility] ?Persons participating in the virtual visit: patient/Nurse Health Advisor ?  ?I discussed the limitations, risks, security and privacy concerns of performing an evaluation and management service by telephone and the availability of in person appointments. The patient expressed understanding and agreed to proceed. ? ?Interactive audio and video telecommunications were attempted between this nurse and patient, however failed, due to patient having technical difficulties OR patient did not have access to video capability.  We continued and completed visit with audio only. ? ?Some vital signs may be absent or patient reported.  ? ?[name], LPN ? ? ?Review of Systems    ? ?Cardiac Risk Factors include: advanced age (>25mn, >>31women);male gender;obesity (BMI >30kg/m2) ? ?   ?Objective:  ?  ?Today's Vitals  ? 02/08/22 1531  ?PainSc: 5   ? ?There is no height or weight on file to calculate BMI. ? ? ?  02/08/2022  ?  3:36 PM 01/24/2021  ? 10:21 AM 09/06/2020  ?  8:38 AM 08/09/2016  ? 10:49 AM 06/27/2016  ? 12:36 PM 06/09/2016  ? 11:44 AM  ?Advanced Directives  ?Does Patient Have a Medical Advance Directive? Yes No No No No No  ?Type of [place]       ?Copy of [place] Chart? No - copy requested       ?Would patient like information on creating a medical advance directive?  Yes (MAU/Ambulatory/Procedural Areas - Information given) Yes (MAU/Ambulatory/Procedural Areas - Information given)  No - patient declined information No - patient declined information  ? ? ?Current Medications  (verified) ?Outpatient Encounter Medications as of 02/08/2022  ?Medication Sig  ? acetaminophen (TYLENOL) 650 MG CR tablet Take 1,300 mg by mouth every 8 (eight) hours as needed for pain.  ? albuterol (VENTOLIN HFA) 108 (90 Base) MCG/ACT inhaler INHALE 2 PUFFS INTO THE LUNGS EVERY 6 HOURS AS NEEDED FOR WHEEZING OR SHORTNESS OF BREATH  ? budesonide-formoterol (SYMBICORT) 160-4.5 MCG/ACT inhaler Inhale 2 puffs into the lungs 2 (two) times daily.  ? ciclopirox (PENLAC) 8 % solution SMARTSIG:sparingly Topical Daily  ? esomeprazole (NEXIUM) 40 MG capsule TAKE 1 CAPSULE(40 MG) BY MOUTH DAILY  ? ibuprofen (ADVIL) 200 MG tablet Take 200 mg by mouth every 6 (six) hours as needed. Pt taking up to 600 mg PRN; pt aware not to take when taking meloxicam  ? sildenafil (REVATIO) 20 MG tablet Take 3 tablets (60 mg total) by mouth daily as needed.  ? ?No facility-administered encounter medications on file as of 02/08/2022.  ? ? ?Allergies (verified) ?Patient has no known allergies.  ? ?History: ?Past Medical History:  ?Diagnosis Date  ? Arthritis   ? Asthma   ? GERD (gastroesophageal reflux disease)   ? ?Past Surgical History:  ?Procedure Laterality Date  ? COLONOSCOPY  01/2015  ? adenomatous polyps  ? COLONOSCOPY WITH PROPOFOL N/A 09/06/2020  ? Procedure: COLONOSCOPY WITH PROPOFOL;  Surgeon: WLucilla Lame MD;  [facility]  Service: Endoscopy;  Laterality: N/A;  priority 4  ? POLYPECTOMY  09/06/2020  ? Procedure: POLYPECTOMY;  Surgeon: WLucilla Lame MD;  [facility];  Service: Endoscopy;;  ? ?Family History  ?Problem Relation Age of Onset  ? Diabetes Mother   ? Alzheimer's disease Father   ? Throat cancer Brother   ? Prostate cancer Paternal Uncle   ? ?Social History  ? ?Socioeconomic History  ? Marital status: Divorced  ?  Spouse name: Not on file  ? Number of children: 2  ? Years of education: Not on file  ? Highest education level: Not on file  ?Occupational History  ? Not on file  ?Tobacco Use   ? Smoking status: Former  ?  Packs/day: 0.50  ?  Years: 20.00  ?  Pack years: 10.00  ?  Types: Cigarettes  ?  Quit date: 06/25/1999  ?  Years since quitting: 22.6  ? Smokeless tobacco: Never  ?Vaping Use  ? Vaping Use: Never used  ?Substance and Sexual Activity  ? Alcohol use: Yes  ?  Alcohol/week: 2.0 standard drinks  ?  Types: 1 Glasses of wine, 1 Cans of beer per week  ? Drug use: No  ? Sexual activity: Yes  ?Other Topics Concern  ? Not on file  ?Social History Narrative  ? Employed training dogs for field trials at [employer]  ? Trains [place] walkers  ? Works on a farm bailing hay as well  ? ?Social Determinants of Health  ? ?Financial Resource Strain: Low Risk   ? Difficulty of Paying Living Expenses: Not hard at all  ?Food Insecurity: No Food Insecurity  ? Worried About Charity fundraiser in the Last Year: Never true  ? Ran Out of Food in the Last Year: Never true  ?Transportation Needs: No Transportation Needs  ? Lack of Transportation (Medical): No  ? Lack of Transportation (Non-Medical): No  ?Physical Activity: Sufficiently Active  ? Days of Exercise per Week: 7 days  ? Minutes of Exercise per Session: 60 min  ?Stress: No Stress Concern Present  ? Feeling of Stress : Not at all  ?Social Connections: Socially Isolated  ? Frequency of Communication with Friends and Family: More than three times a week  ? Frequency of Social Gatherings with Friends and Family: More than three times a week  ? Attends Religious Services: Never  ? Active Member of Clubs or Organizations: No  ? Attends Archivist Meetings: Never  ? Marital Status: Divorced  ? ? ?Tobacco Counseling ?Counseling given: Not Answered ? ? ?Clinical Intake: ? ?Pre-visit preparation completed: Yes ? ?Pain : 0-10 ?Pain Score: 5  ?Pain Type: Chronic pain ?Pain Location: Knee ?Pain Orientation: Right, Left ?Pain Descriptors / Indicators: Aching, Sore ?Pain Onset: More than a month ago ?Pain Frequency: Intermittent ? ?  ? ?Nutritional  Risks: None ?Diabetes: No ? ?How often do you need to have someone help you when you read instructions, pamphlets, or other written materials from your doctor or pharmacy?: 1 - Never ? ? ? ?Interpreter Needed?: No ? ?Information entered by :: [name] LPN ? ? ?Activities of Daily Living ? ?  02/08/2022  ?  3:37 PM 08/01/2021  ?  9:01 AM  ?In your present state of health, do you have any difficulty performing the following activities:  ?Hearing? 0 1  ?Vision? 0 0  ?Difficulty concentrating or making decisions? 0 0  ?Walking or climbing stairs? 0 1  ?Dressing or bathing? 0 0  ?Doing errands, shopping? 0 0  ?Preparing Food and eating ? N   ?Using the Toilet? N   ?In the  past six months, have you accidently leaked urine? N   ?Do you have problems with loss of bowel control? N   ?Managing your Medications? N   ?Managing your Finances? N   ?Housekeeping or managing your Housekeeping? N   ? ? ?Patient Care Team: ?Glean Hess, MD as PCP - General (Internal Medicine) ?Lucilla Lame, MD as Consulting Physician (Gastroenterology) ?Earnestine Leys, MD (Orthopedic Surgery) ? ?Indicate any recent Medical Services you may have received from other than [facility] providers in the past year (date may be approximate). ? ?   ?Assessment:  ? This is a routine wellness examination for Semaj. ? ?Hearing/Vision screen ?Hearing Screening - Comments:: Pt c/o mild hearing difficulty; plans for hearing aids ? ?Vision Screening - Comments:: [facility] screen[facility]  ? ?Dietary issues and exercise activities discussed: ?Current Exercise Habits: Home exercise routine, Type of exercise: Other - see comments (outside work; farming), Time (Minutes): 60, Frequency (Times/Week): 7, Weekly Exercise (Minutes/Week): 420, Intensity: Moderate, Exercise limited by: orthopedic condition(s) ? ? Goals Addressed   ?None ?  ? ?Depression Screen ? ?  02/08/2022  ?  3:36 PM 08/01/2021  ?  9:01 AM 01/24/2021  ? 10:20 AM 07/30/2020  ?   8:19 AM 05/12/2020  ?  2:43 PM 06/02/2019  ? 10:29 AM 08/20/2017  ?  8:42 AM  ?PHQ 2/9 Scores  ?PHQ - 2 Score 0 0 0 0 0 0 0  ?PHQ- 9 Score  0  0 0    ?  ?Fall Risk ? ?  02/08/2022  ?  3:37 PM 08/01/2021  ?  9:01 AM 01/24/2021  ? 10:2

## 2022-02-08 NOTE — Patient Instructions (Signed)
Derek Grant , ?Thank you for taking time to come for your Medicare Wellness Visit. I appreciate your ongoing commitment to your health goals. Please review the following plan we discussed and let me know if I can assist you in the future.  ? ?Screening recommendations/referrals: ?Colonoscopy: done 09/06/20. Repeat 08/2027 ?Recommended yearly ophthalmology/optometry visit for glaucoma screening and checkup ?Recommended yearly dental visit for hygiene and checkup ? ?Vaccinations: ?Influenza vaccine: done 08/01/21 ?Pneumococcal vaccine: done 05/12/20 ?Tdap vaccine: done 06/09/16 ?Shingles vaccine: Shingrix discussed. Please contact your pharmacy for coverage information.  ?Covid-19:  done 12/05/19, 12/30/19 & 10/05/20 ? ?Advanced directives: Please bring a copy of your health care power of attorney and living will to the office at your convenience.  ? ?Conditions/risks identified: Keep up the great work! ? ?Next appointment: Follow up in one year for your annual wellness visit.  ? ?Preventive Care 74 Years and Older, Male ?Preventive care refers to lifestyle choices and visits with your health care provider that can promote health and wellness. ?What does preventive care include? ?A yearly physical exam. This is also called an annual well check. ?Dental exams once or twice a year. ?Routine eye exams. Ask your health care provider how often you should have your eyes checked. ?Personal lifestyle choices, including: ?Daily care of your teeth and gums. ?Regular physical activity. ?Eating a healthy diet. ?Avoiding tobacco and drug use. ?Limiting alcohol use. ?Practicing safe sex. ?Taking low doses of aspirin every day. ?Taking vitamin and mineral supplements as recommended by your health care provider. ?What happens during an annual well check? ?The services and screenings done by your health care provider during your annual well check will depend on your age, overall health, lifestyle risk factors, and family history of  disease. ?Counseling  ?Your health care provider may ask you questions about your: ?Alcohol use. ?Tobacco use. ?Drug use. ?Emotional well-being. ?Home and relationship well-being. ?Sexual activity. ?Eating habits. ?History of falls. ?Memory and ability to understand (cognition). ?Work and work Statistician. ?Screening  ?You may have the following tests or measurements: ?Height, weight, and BMI. ?Blood pressure. ?Lipid and cholesterol levels. These may be checked every 5 years, or more frequently if you are over 36 years old. ?Skin check. ?Lung cancer screening. You may have this screening every year starting at age 70 if you have a 30-pack-year history of smoking and currently smoke or have quit within the past 15 years. ?Fecal occult blood test (FOBT) of the stool. You may have this test every year starting at age 16. ?Flexible sigmoidoscopy or colonoscopy. You may have a sigmoidoscopy every 5 years or a colonoscopy every 10 years starting at age 98. ?Prostate cancer screening. Recommendations will vary depending on your family history and other risks. ?Hepatitis C blood test. ?Hepatitis B blood test. ?Sexually transmitted disease (STD) testing. ?Diabetes screening. This is done by checking your blood sugar (glucose) after you have not eaten for a while (fasting). You may have this done every 1-3 years. ?Abdominal aortic aneurysm (AAA) screening. You may need this if you are a current or former smoker. ?Osteoporosis. You may be screened starting at age 14 if you are at high risk. ?Talk with your health care provider about your test results, treatment options, and if necessary, the need for more tests. ?Vaccines  ?Your health care provider may recommend certain vaccines, such as: ?Influenza vaccine. This is recommended every year. ?Tetanus, diphtheria, and acellular pertussis (Tdap, Td) vaccine. You may need a Td booster every 10 years. ?Zoster vaccine.  You may need this after age 23. ?Pneumococcal 13-valent  conjugate (PCV13) vaccine. One dose is recommended after age 6. ?Pneumococcal polysaccharide (PPSV23) vaccine. One dose is recommended after age 38. ?Talk to your health care provider about which screenings and vaccines you need and how often you need them. ?This information is not intended to replace advice given to you by your health care provider. Make sure you discuss any questions you have with your health care provider. ?Document Released: 11/05/2015 Document Revised: 06/28/2016 Document Reviewed: 08/10/2015 ?Elsevier Interactive Patient Education ? 2017 [place]. ? ?Fall Prevention in the Home ?Falls can cause injuries. They can happen to people of all ages. There are many things you can do to make your home safe and to help prevent falls. ?What can I do on the outside of my home? ?Regularly fix the edges of walkways and driveways and fix any cracks. ?Remove anything that might make you trip as you walk through a door, such as a raised step or threshold. ?Trim any bushes or trees on the path to your home. ?Use bright outdoor lighting. ?Clear any walking paths of anything that might make someone trip, such as rocks or tools. ?Regularly check to see if handrails are loose or broken. Make sure that both sides of any steps have handrails. ?Any raised decks and porches should have guardrails on the edges. ?Have any leaves, snow, or ice cleared regularly. ?Use sand or salt on walking paths during winter. ?Clean up any spills in your garage right away. This includes oil or grease spills. ?What can I do in the bathroom? ?Use night lights. ?Install grab bars by the toilet and in the tub and shower. Do not use towel bars as grab bars. ?Use non-skid mats or decals in the tub or shower. ?If you need to sit down in the shower, use a plastic, non-slip stool. ?Keep the floor dry. Clean up any water that spills on the floor as soon as it happens. ?Remove soap buildup in the tub or shower regularly. ?Attach bath mats  securely with double-sided non-slip rug tape. ?Do not have throw rugs and other things on the floor that can make you trip. ?What can I do in the bedroom? ?Use night lights. ?Make sure that you have a light by your bed that is easy to reach. ?Do not use any sheets or blankets that are too big for your bed. They should not hang down onto the floor. ?Have a firm chair that has side arms. You can use this for support while you get dressed. ?Do not have throw rugs and other things on the floor that can make you trip. ?What can I do in the kitchen? ?Clean up any spills right away. ?Avoid walking on wet floors. ?Keep items that you use a lot in easy-to-reach places. ?If you need to reach something above you, use a strong step stool that has a grab bar. ?Keep electrical cords out of the way. ?Do not use floor polish or wax that makes floors slippery. If you must use wax, use non-skid floor wax. ?Do not have throw rugs and other things on the floor that can make you trip. ?What can I do with my stairs? ?Do not leave any items on the stairs. ?Make sure that there are handrails on both sides of the stairs and use them. Fix handrails that are broken or loose. Make sure that handrails are as long as the stairways. ?Check any carpeting to make sure that it is  firmly attached to the stairs. Fix any carpet that is loose or worn. ?Avoid having throw rugs at the top or bottom of the stairs. If you do have throw rugs, attach them to the floor with carpet tape. ?Make sure that you have a light switch at the top of the stairs and the bottom of the stairs. If you do not have them, ask someone to add them for you. ?What else can I do to help prevent falls? ?Wear shoes that: ?Do not have high heels. ?Have rubber bottoms. ?Are comfortable and fit you well. ?Are closed at the toe. Do not wear sandals. ?If you use a stepladder: ?Make sure that it is fully opened. Do not climb a closed stepladder. ?Make sure that both sides of the stepladder  are locked into place. ?Ask someone to hold it for you, if possible. ?Clearly mark and make sure that you can see: ?Any grab bars or handrails. ?First and last steps. ?Where the edge of each step is. ?Use tools that hel

## 2022-02-23 ENCOUNTER — Encounter: Payer: Self-pay | Admitting: Internal Medicine

## 2022-02-23 ENCOUNTER — Ambulatory Visit: Payer: Medicare Other | Admitting: Internal Medicine

## 2022-02-23 DIAGNOSIS — J4530 Mild persistent asthma, uncomplicated: Secondary | ICD-10-CM

## 2022-02-23 MED ORDER — FAMOTIDINE 20 MG PO TABS: ORAL_TABLET | ORAL | 11 refills | Status: DC

## 2022-02-23 NOTE — Patient Instructions (Signed)
Continue Nexium 40 mg Take 30-60 min before first meal of the day and add pepcid (famotidine) after supper  ? ?GERD (REFLUX)  is an extremely common cause of respiratory symptoms just like yours , many times with no obvious heartburn at all.  ? ? It can be treated with medication, but also with lifestyle changes including elevation of the head of your bed (ideally with 6-8inch blocks under the headboard of your bed),  Smoking cessation, avoidance of late meals, excessive alcohol, and avoid fatty foods, chocolate, peppermint, colas, red wine, and acidic juices such as orange juice.  ?NO MINT OR MENTHOL PRODUCTS SO NO COUGH DROPS  ?USE SUGARLESS CANDY INSTEAD (Jolley ranchers or Stover's or Life Savers) or even ice chips will also do - the key is to swallow to prevent all throat clearing. ?NO OIL BASED VITAMINS - use powdered substitutes.  Avoid fish oil when coughing.  ? ?Symbicort 160 up to 1-2 puffs every 12 hours  - if any trouble with breathing coughing or wheezing or need for your albuterol then resume symbicort 160 Take 2 puffs first thing in am and then another 2 puffs about 12 hours later.  ?  ? ?Work on inhaler technique:  relax and gently blow all the way out then take a nice smooth full deep breath back in, triggering the inhaler at same time you start breathing in.  Hold for up to 5 seconds if you can. Blow out thru nose. Rinse and gargle with water when done.  If mouth or throat bother you at all,  try brushing teeth/gums/tongue with arm and hammer toothpaste/ make a slurry and gargle and spit out.  ? ?  ?Please schedule a follow up visit in 3 months but call sooner if needed bring your inhalers ?  ?    ?   ?

## 2022-02-23 NOTE — Progress Notes (Signed)
? ?Derek Grant, male    DOB: [birth year]   MRN: [id number] ? ? ?Brief patient profile:  ?65   yobm  quit smoking 2000 = 180-190 wt  referred to pulmonary clinic in [facility]  12/22/2021 by Dr Army Melia  for asthma onset of symptoms his late 80s  > eval by sev doctors one was specialist in [place] some better but not back to usual self.  ? ? ?History of Present Illness  ?12/22/2021  Pulmonary/ 1st office eval/ Melvyn Novas / [facility]  ?Chief Complaint  ?Patient presents with  ? pulmonary consult  ?  Per Dr. Army Melia- hx of asthma. C/o occ wheezing.   ?Dyspnea:  MMRC1 = can walk nl pace, flat grade, can't hurry or go uphills or steps s sob   ?Cough: none  ?Sleep: able to lie 2 pillow ?SABA use: never uses, can't tell when stops Breo  ?Rec ?Nexium 40 mg Take 30-60 min before first meal of the day  ?GERD diet reviewed, bed blocks rec  ?For the next two weeks, try blowing BREO out thru nose, then try for one week every other day then every 3rd day  ?Only use your albuterol as a rescue medication ?Ok to try albuterol 15 min before an activity (on alternating days)  that you know would usually make you short of breath ?PFT's in about 6 weeks. ?Please schedule a follow up visit in 3 months but call sooner if needed - bring both inhalers   ? ? ?02/23/2022  f/u ov/Elianny Buxbaum/ [facility] re: AB  maint on symbicort 160  2bid but not sure he needs it ?Chief Complaint  ?Patient presents with  ? Follow-up  ?  No concerns.  ?Dyspnea:  no problem flat surface walks anywhere  ?Cough: none   ?Sleeping: flat bed / on side  occ noct  wheeze at occ but doesn't wake him  up as long as stays off back  ?SABA use: none  ?02: none  ?Covid status:   3 vax  ? ? ?No obvious day to day or daytime variability or assoc excess/ purulent sputum or mucus plugs or hemoptysis or cp or chest tightness, subjective wheeze or overt sinus or hb symptoms.  ? ?Sleeping as above  without nocturnal  or early am exacerbation  of respiratory  c/o's or need  for noct saba. Also denies any obvious fluctuation of symptoms with weather or environmental changes or other aggravating or alleviating factors except as outlined above  ? ?No unusual exposure hx or h/o childhood pna/ asthma or knowledge of premature birth. ? ?Current Allergies, Complete Past Medical History, Past Surgical History, Family History, and Social History were reviewed in [employer] record. ? ?ROS  The following are not active complaints unless bolded ?Hoarseness, sore throat, dysphagia, dental problems, itching, sneezing,  nasal congestion or discharge of excess mucus or purulent secretions, ear ache,   fever, chills, sweats, unintended wt loss or wt gain, classically pleuritic or exertional cp,  orthopnea pnd or arm/hand swelling  or leg swelling, presyncope, palpitations, abdominal pain, anorexia, nausea, vomiting, diarrhea  or change in bowel habits or change in bladder habits, change in stools or change in urine, dysuria, hematuria,  rash, arthralgias, visual complaints, headache, numbness, weakness or ataxia or problems with walking or coordination,  change in mood or  memory. ?      ? ?Current Meds  ?Medication Sig  ? acetaminophen (TYLENOL) 650 MG CR tablet Take 1,300 mg by  mouth every 8 (eight) hours as needed for pain.  ? albuterol (VENTOLIN HFA) 108 (90 Base) MCG/ACT inhaler INHALE 2 PUFFS INTO THE LUNGS EVERY 6 HOURS AS NEEDED FOR WHEEZING OR SHORTNESS OF BREATH  ? budesonide-formoterol (SYMBICORT) 160-4.5 MCG/ACT inhaler Inhale 2 puffs into the lungs 2 (two) times daily.  ? ciclopirox (PENLAC) 8 % solution SMARTSIG:sparingly Topical Daily  ? esomeprazole (NEXIUM) 40 MG capsule TAKE 1 CAPSULE(40 MG) BY MOUTH DAILY  ? ibuprofen (ADVIL) 200 MG tablet Take 200 mg by mouth every 6 (six) hours as needed. Pt taking up to 600 mg PRN; pt aware not to take when taking meloxicam  ? sildenafil (REVATIO) 20 MG tablet Take 3 tablets (60 mg total) by mouth daily as needed.  ?     ?   ? ? ? ?Objective:  ?  ?Wt Readings from Last 3 Encounters:  ?02/23/22 251 lb 9.6 oz (114.1 kg)  ?12/22/21 251 lb 12.8 oz (114.2 kg)  ?08/01/21 260 lb (117.9 kg)  ?  ? ? ?Vital signs reviewed  02/23/2022  - Note at rest 02 sats  97% on RA  ? ?General appearance:    amb pleasant bm nad ? ? HEENT : nl oropharynx   ? ? ?NECK :  without JVD/Nodes/TM/ nl carotid upstrokes bilaterally ? ? ?LUNGS: no acc muscle use,  Nl contour chest which is clear to A and P bilaterally without cough on insp or exp maneuvers ? ? ?CV:  RRR  no s3 or murmur or increase in P2, and no edema  ? ?ABD:  mod obese soft and nontender with nl inspiratory excursion in the supine position. No bruits or organomegaly appreciated, bowel sounds nl ? ?MS:  Nl gait/ ext warm without deformities, calf tenderness, cyanosis or clubbing ?No obvious joint restrictions  ? ?SKIN: warm and dry without lesions   ? ?NEURO:  alert, approp, nl sensorium with  no motor or cerebellar deficits apparent.    ?  ? ?  ? ? ? ?   ?Assessment  ? ?  ?  ?   ?

## 2022-02-23 NOTE — Assessment & Plan Note (Addendum)
Stopped smoking 2000 at wt 180-190   ?- PFT's  12/23/2021   FEV1 3.34 (108 % ) ratio 0.82  p 21 % improvement from saba p Breo prior to study with DLCO  24.32 (89%)  With a classic VCD pattern f/v loop  ?- ? Better on Breo ? > try max gerd rx and wean off as of 12/22/2021 @ wt 251  Due to pseudowheeze on exam  and see what if any symptoms flares and if so try change to symbicort 160 2bid > improved 02/23/2022 x for noct "wheeze" on back> max gerd rx then ? Taper to 80 bid prn  ? ?Likely has only mild asthma with mostly upper airway wheeze / vcd ? gerd related so rec max rx for gerd and try symbicort 160 prn based on two studies from [facility]  378; 20 p 1865 (2018) and 380 : p2020-30 (2019) in pts with mild asthma it is reasonable to use symbicort  "prn" flare in this setting but I emphasized this was only shown with symbicort and takes advantage of the rapid onset of action but is not the same as "rescue therapy" but can be stopped once the acute symptoms have resolved and the need for rescue has been minimized (< 2 x weekly)   ? ?If does great on the 160 can cut back to the 80 to reduce potential for upper airway irritability from ICS ? ? ? - The proper method of use, as well as anticipated side effects, of a metered-dose inhaler were discussed and demonstrated to the patient using teach back method.  ? ?>>>  F/u in 3 m, sooner prn with all inhalers in hand using a trust but verify approach to confirm accurate Medication  Reconciliation The principal here is that until we are certain that the  patients are doing what we've asked, it makes no sense to ask them to do more.  ? ? ?Each maintenance medication was reviewed in detail including emphasizing most importantly the difference between maintenance and prns and under what circumstances the prns are to be triggered using an action plan format where appropriate. ? ?Total time for H and P, chart review, counseling, reviewing hfa device(s) and generating customized AVS unique  to this office visit / same day charting = 32 min  ?

## 2022-03-01 DIAGNOSIS — H43392 Other vitreous opacities, left eye: Secondary | ICD-10-CM | POA: Diagnosis not present

## 2022-03-06 DIAGNOSIS — M170 Bilateral primary osteoarthritis of knee: Secondary | ICD-10-CM | POA: Diagnosis not present

## 2022-03-06 DIAGNOSIS — M25462 Effusion, left knee: Secondary | ICD-10-CM | POA: Diagnosis not present

## 2022-03-17 ENCOUNTER — Telehealth: Payer: Self-pay | Admitting: Internal Medicine

## 2022-03-17 NOTE — Telephone Encounter (Signed)
Noted  KP 

## 2022-03-17 NOTE — Telephone Encounter (Signed)
Records were sent out.

## 2022-03-17 NOTE — Telephone Encounter (Signed)
Copied from [place] [id number]. Topic: Medical Record Request - Other >> Mar 17, 2022  2:54 PM Pawlus, Brayton Layman A wrote: Reason for CRM: Caller from [facility] was checking to see if their medical records request was received.

## 2022-03-17 NOTE — Telephone Encounter (Signed)
Records was sent out.  KP

## 2022-03-20 ENCOUNTER — Other Ambulatory Visit: Payer: Self-pay | Admitting: Internal Medicine

## 2022-03-20 DIAGNOSIS — K219 Gastro-esophageal reflux disease without esophagitis: Secondary | ICD-10-CM

## 2022-03-21 ENCOUNTER — Telehealth: Payer: Self-pay | Admitting: Internal Medicine

## 2022-03-21 NOTE — Telephone Encounter (Signed)
Copied from [place] [phone]. Topic: General - Other >> Mar 21, 2022  3:32 PM Erick Blinks wrote: Reason for CRM:  Derry Skill called requesting to speak to Brenton Grills regarding the medical records request submitted 03/13/2022, contact info on fax submission  Best contact:[phone] ext [phone]

## 2022-03-22 NOTE — Telephone Encounter (Signed)
Requested Prescriptions  Pending Prescriptions Disp Refills  . esomeprazole (NEXIUM) 40 MG capsule [Pharmacy Med Name: ESOMEPRAZOLE MAGNESIUM '40MG'$  DR CAPS] 90 capsule 0    Sig: TAKE 1 CAPSULE(40 MG) BY MOUTH DAILY     Gastroenterology: Proton Pump Inhibitors 2 Passed - 03/20/2022  6:20 AM      Passed - ALT in normal range and within 360 days    ALT  Date Value Ref Range Status  08/01/2021 17 0 - 44 IU/L Final         Passed - AST in normal range and within 360 days    AST  Date Value Ref Range Status  08/01/2021 18 0 - 40 IU/L Final         Passed - Valid encounter within last 12 months    Recent Outpatient Visits          7 months ago Annual physical exam   [facility] Glean Hess, MD   1 year ago Annual physical exam   [facility] Glean Hess, MD   1 year ago Need for vaccination for pneumococcus   [facility] Glean Hess, MD   2 years ago Annual physical exam   [facility] Glean Hess, MD   4 years ago Need for influenza vaccination   [facility] Glean Hess, MD      Future Appointments            In 1 month Melvyn Novas, Christena Deem, MD [facility]   In 4 months Army Melia, Jesse Sans, MD [facility], [facility]

## 2022-04-06 DIAGNOSIS — M25462 Effusion, left knee: Secondary | ICD-10-CM | POA: Diagnosis not present

## 2022-04-06 DIAGNOSIS — M170 Bilateral primary osteoarthritis of knee: Secondary | ICD-10-CM | POA: Diagnosis not present

## 2022-05-03 DIAGNOSIS — M170 Bilateral primary osteoarthritis of knee: Secondary | ICD-10-CM | POA: Diagnosis not present

## 2022-05-03 DIAGNOSIS — M25561 Pain in right knee: Secondary | ICD-10-CM | POA: Diagnosis not present

## 2022-05-03 DIAGNOSIS — M25562 Pain in left knee: Secondary | ICD-10-CM | POA: Diagnosis not present

## 2022-05-06 DIAGNOSIS — M170 Bilateral primary osteoarthritis of knee: Secondary | ICD-10-CM | POA: Diagnosis not present

## 2022-05-06 DIAGNOSIS — M25561 Pain in right knee: Secondary | ICD-10-CM | POA: Diagnosis not present

## 2022-05-18 ENCOUNTER — Other Ambulatory Visit
Admission: RE | Admit: 2022-05-18 | Discharge: 2022-05-18 | Disposition: A | Discharge status: Home / Self Care | Payer: Medicare Other | Attending: Internal Medicine | Admitting: Internal Medicine

## 2022-05-18 ENCOUNTER — Ambulatory Visit: Payer: Medicare Other | Admitting: Internal Medicine

## 2022-05-18 ENCOUNTER — Encounter: Payer: Self-pay | Admitting: Internal Medicine

## 2022-05-18 DIAGNOSIS — J4530 Mild persistent asthma, uncomplicated: Secondary | ICD-10-CM

## 2022-05-18 LAB — CBC WITH DIFFERENTIAL/PLATELET
Abs Immature Granulocytes: 0.01 10*3/uL (ref 0.00–0.07)
Basophils Absolute: 0 10*3/uL (ref 0.0–0.1)
Basophils Relative: 1 %
Eosinophils Absolute: 0.4 10*3/uL (ref 0.0–0.5)
Eosinophils Relative: 9 %
HCT: 40.9 % (ref 39.0–52.0)
Hemoglobin: 13.3 g/dL (ref 13.0–17.0)
Immature Granulocytes: 0 %
Lymphocytes Relative: 37 %
Lymphs Abs: 1.7 10*3/uL (ref 0.7–4.0)
MCH: 28.1 pg (ref 26.0–34.0)
MCHC: 32.5 g/dL (ref 30.0–36.0)
MCV: 86.3 fL (ref 80.0–100.0)
Monocytes Absolute: 0.3 10*3/uL (ref 0.1–1.0)
Monocytes Relative: 7 %
Neutro Abs: 2.1 10*3/uL (ref 1.7–7.7)
Neutrophils Relative %: 46 %
Platelets: 266 10*3/uL (ref 150–400)
RBC: 4.74 MIL/uL (ref 4.22–5.81)
RDW: 13.1 % (ref 11.5–15.5)
WBC: 4.5 10*3/uL (ref 4.0–10.5)
nRBC: 0 % (ref 0.0–0.2)

## 2022-05-18 NOTE — Patient Instructions (Signed)
Symbicort 160 1-2  puffs 1st thing in am and 12 hours later  Work on inhaler technique:  relax and gently blow all the way out then take a nice smooth full deep breath back in, triggering the inhaler at same time you start breathing in.  Hold breath in for at least  5 seconds if you can. Blow out symbicort thru nose. Rinse and gargle with water when done.  If mouth or throat bother you at all,  try brushing teeth/gums/tongue with arm and hammer toothpaste/ make a slurry and gargle and spit out.     Nexium 40 mg Take 30-60 min before first decent meal of the day   GERD (REFLUX)  is an extremely common cause of respiratory symptoms just like yours , many times with no obvious heartburn at all.    It can be treated with medication, but also with lifestyle changes including elevation of the head of your bed (ideally with 6-8inch blocks under the headboard of your bed),  Smoking cessation, avoidance of late meals, excessive alcohol, and avoid fatty foods, chocolate, peppermint, colas, red wine, and acidic juices such as orange juice.  NO MINT OR MENTHOL PRODUCTS SO NO COUGH DROPS  USE SUGARLESS CANDY INSTEAD (Jolley ranchers or Stover's or Life Savers) or even ice chips will also do - the key is to swallow to prevent all throat clearing. NO OIL BASED VITAMINS - use powdered substitutes.  Avoid fish oil when coughing.   Please remember to go to the lab department   for your tests - we will call you with the results when they are available.      Please schedule a follow up visit in 3 months but call sooner if needed - bring inhaler

## 2022-05-18 NOTE — Assessment & Plan Note (Signed)
Stopped smoking 2000 at wt 180-190   - PFT's  12/23/2021   FEV1 3.34 (108 % ) ratio 0.82  p 21 % improvement from saba p Breo prior to study with DLCO  24.32 (89%)  With a classic VCD pattern f/v loop  - ? Better on Breo ? > try max gerd rx and wean off as of 12/22/2021 @ wt 251  Due to pseudowheeze on exam  and see what if any symptoms flares and if so try change to symbicort 160 2bid > improved 02/23/2022 x for noct "wheeze" on back> max gerd rx then ? Taper to 80 bid prn  .05/18/2022  After extensive coaching inhaler device,  effectiveness =   75% - Allergy screen 05/18/2022 >  Eos 0. /  IgE      DDX of  difficult airways management almost all start with A and  include Adherence, Ace Inhibitors, Acid Reflux, Active Sinus Disease, Alpha 1 Antitripsin deficiency, Anxiety masquerading as Airways dz,  ABPA,  Allergy(esp in young), Aspiration (esp in elderly), Adverse effects of meds,  Active smoking or vaping, A bunch of PE's (a small clot burden can't cause this syndrome unless there is already severe underlying pulm or vascular dz with poor reserve) plus two Bs  = Bronchiectasis and Beta blocker use..and one C= CHF   Adherence is always the initial "prime suspect" and is a multilayered concern that requires a "trust but verify" approach in every patient - starting with knowing how to use medications, especially inhalers, correctly, keeping up with refills and understanding the fundamental difference between maintenance and prns vs those medications only taken for a very short course and then stopped and not refilled.  -  See hfa teaching -  Return with all meds in hand using a trust but verify approach to confirm accurate Medication  Reconciliation The principal here is that until we are certain that the  patients are doing what we've asked, it makes no sense to ask them to do more.  ? Allergy > check screen  ? Acid (or non-acid) GERD > always difficult to exclude as up to 75% of pts in some series report no  assoc GI/ Heartburn symptoms> rec max (24h)  acid suppression and diet restrictions/ reviewed and instructions given in writing.   ? Chf/ cardiac asthma> nothing to suggest  F/u 3 months on symb 160 adjust to 1-2 bid - call if needed           Each maintenance medication was reviewed in detail including emphasizing most importantly the difference between maintenance and prns and under what circumstances the prns are to be triggered using an action plan format where appropriate.  Total time for H and P, chart review, counseling, reviewing hfa device(s) and generating customized AVS unique to this office visit / same day charting  > 30 min

## 2022-05-18 NOTE — Progress Notes (Signed)
Derek Grant, male    DOB: [birth year]   MRN: [id number]   Brief patient profile:  42   yobm  quit smoking 2000 = 180-190 wt  referred to pulmonary clinic in [facility]  12/22/2021 by Dr Army Melia  for asthma onset of symptoms his late 34s  > eval by sev doctors one was specialist in [place] some better but not back to usual self.    History of Present Illness  12/22/2021  Pulmonary/ 1st office eval/ Ladonna Vanorder / [facility]  Chief Complaint  Patient presents with   pulmonary consult    Per Dr. Army Melia- hx of asthma. C/o occ wheezing.   Dyspnea:  MMRC1 = can walk nl pace, flat grade, can't hurry or go uphills or steps s sob   Cough: none  Sleep: able to lie 2 pillow SABA use: never uses, can't tell when stops Breo  Rec Nexium 40 mg Take 30-60 min before first meal of the day  GERD diet reviewed, bed blocks rec  For the next two weeks, try blowing BREO out thru nose, then try for one week every other day then every 3rd day  Only use your albuterol as a rescue medication Ok to try albuterol 15 min before an activity (on alternating days)  that you know would usually make you short of breath PFT's in about 6 weeks. Please schedule a follow up visit in 3 months but call sooner if needed - bring both inhalers     02/23/2022  f/u ov/Arnita Koons/ [facility] re: AB  maint on symbicort 160  2bid but not sure he needs it Chief Complaint  Patient presents with   Follow-up    No concerns.  Dyspnea:  no problem flat surface walks anywhere  Cough: none   Sleeping: flat bed / on side  occ noct  wheeze at occ but doesn't wake him  up as long as stays off back  SABA use: none  02: none  Covid status:   3 vax  Rec Continue Nexium 40 mg Take 30-60 min before first meal of the day and add pepcid (famotidine) after supper  GERD  Symbicort 160 up to 1-2 puffs every 12 hours  - if any trouble with breathing coughing or wheezing or need for your albuterol then resume symbicort 160 Take 2  puffs first thing in am and then another 2 puffs about 12 hours later.   Work on inhaler technique:     Please schedule a follow up visit in 3 months but call sooner if needed bring your inhalers    05/18/2022  f/u ov/Tonea Leiphart/ [facility] re: asthma vs vcd  maint on symbicort 2bid  empty device x m Chief Complaint  Patient presents with   Follow-up    Pepcid is not working.  He was told one of his inhalers was recalled.  Wheezes at night when he has been in a cold trailer at work all day.  Dyspnea: Not limited by breathing from desired activities   Cough: still some harsh cough/ w Sleeping: bed flat/ some pillows  SABA use: none  02: none On ppi in am, not ac, and eats large meal near he then "wheezes due to A/C     No obvious day to day or daytime variability or assoc excess/ purulent sputum or mucus plugs or hemoptysis or cp or chest tightness, subjective   or overt sinus or hb symptoms.     Also denies any obvious  fluctuation of symptoms with weather or environmental changes or other aggravating or alleviating factors except as outlined above   No unusual exposure hx or h/o childhood pna/ asthma or knowledge of premature birth.  Current Allergies, Complete Past Medical History, Past Surgical History, Family History, and Social History were reviewed in [employer] record.  ROS  The following are not active complaints unless bolded Hoarseness, sore throat/globus , dysphagia, dental problems, itching, sneezing,  nasal congestion or discharge of excess mucus or purulent secretions, ear ache,   fever, chills, sweats, unintended wt loss or wt gain, classically pleuritic or exertional cp,  orthopnea pnd or arm/hand swelling  or leg swelling, presyncope, palpitations, abdominal pain, anorexia, nausea, vomiting, diarrhea  or change in bowel habits or change in bladder habits, change in stools or change in urine, dysuria, hematuria,  rash, arthralgias, visual  complaints, headache, numbness, weakness or ataxia or problems with walking or coordination,  change in mood or  memory.        Current Meds  Medication Sig   acetaminophen (TYLENOL) 650 MG CR tablet Take 1,300 mg by mouth every 8 (eight) hours as needed for pain.   albuterol (VENTOLIN HFA) 108 (90 Base) MCG/ACT inhaler INHALE 2 PUFFS INTO THE LUNGS EVERY 6 HOURS AS NEEDED FOR WHEEZING OR SHORTNESS OF BREATH   budesonide-formoterol (SYMBICORT) 160-4.5 MCG/ACT inhaler Inhale 2 puffs into the lungs 2 (two) times daily.   ciclopirox (PENLAC) 8 % solution SMARTSIG:sparingly Topical Daily   esomeprazole (NEXIUM) 40 MG capsule TAKE 1 CAPSULE(40 MG) BY MOUTH DAILY   famotidine (PEPCID) 20 MG tablet One after supper   ibuprofen (ADVIL) 200 MG tablet Take 200 mg by mouth every 6 (six) hours as needed. Pt taking up to 600 mg PRN; pt aware not to take when taking meloxicam   sildenafil (REVATIO) 20 MG tablet Take 3 tablets (60 mg total) by mouth daily as needed.             Objective:    05/18/2022       247    02/23/22 251 lb 9.6 oz (114.1 kg)  12/22/21 251 lb 12.8 oz (114.2 kg)  08/01/21 260 lb (117.9 kg)     Vital signs reviewed  05/18/2022  - Note at rest 02 sats  96% on RA   General appearance:    amb bm, freq throat clearing    HEENT : Oropharynx  slt erythema, no pnd      Nasal turbinates nl    NECK :  without  apparent JVD/ palpable Nodes/TM    LUNGS: no acc muscle use,  Nl contour chest which is clear to A and P bilaterally without cough on insp or exp maneuvers   CV:  RRR  no s3 or murmur or increase in P2, and no edema   ABD:  obese soft and nontender with nl inspiratory excursion in the supine position. No bruits or organomegaly appreciated   MS:  Nl gait/ ext warm without deformities Or obvious joint restrictions  calf tenderness, cyanosis or clubbing    SKIN: warm and dry without lesions    NEURO:  alert, approp, nl sensorium with  no motor or cerebellar deficits  apparent.               Assessment

## 2022-05-22 LAB — IGE: IgE (Immunoglobulin E), Serum: 264 IU/mL (ref 6–495)

## 2022-06-06 DIAGNOSIS — M170 Bilateral primary osteoarthritis of knee: Secondary | ICD-10-CM | POA: Diagnosis not present

## 2022-06-06 DIAGNOSIS — M25561 Pain in right knee: Secondary | ICD-10-CM | POA: Diagnosis not present

## 2022-06-18 ENCOUNTER — Other Ambulatory Visit: Payer: Self-pay | Admitting: Internal Medicine

## 2022-06-18 DIAGNOSIS — K219 Gastro-esophageal reflux disease without esophagitis: Secondary | ICD-10-CM

## 2022-06-19 NOTE — Telephone Encounter (Signed)
Requested Prescriptions  Pending Prescriptions Disp Refills  . esomeprazole (NEXIUM) 40 MG capsule [Pharmacy Med Name: ESOMEPRAZOLE MAGNESIUM '40MG'$  DR CAPS] 90 capsule 0    Sig: TAKE 1 CAPSULE(40 MG) BY MOUTH DAILY     Gastroenterology: Proton Pump Inhibitors 2 Passed - 06/18/2022  6:22 AM      Passed - ALT in normal range and within 360 days    ALT  Date Value Ref Range Status  08/01/2021 17 0 - 44 IU/L Final         Passed - AST in normal range and within 360 days    AST  Date Value Ref Range Status  08/01/2021 18 0 - 40 IU/L Final         Passed - Valid encounter within last 12 months    Recent Outpatient Visits          10 months ago Annual physical exam   [facility], Jesse Sans, MD   1 year ago Annual physical exam   [place] [facility], Jesse Sans, MD   2 years ago Need for vaccination for pneumococcus   [facility] at [facility], Jesse Sans, MD   3 years ago Annual physical exam   [place] [facility] at [facility], Jesse Sans, MD   4 years ago Need for influenza vaccination   [facility] at [facility], Jesse Sans, MD      Future Appointments            In 1 month Army Melia, Jesse Sans, MD [place] [facility]

## 2022-06-28 DIAGNOSIS — M25561 Pain in right knee: Secondary | ICD-10-CM | POA: Diagnosis not present

## 2022-06-28 DIAGNOSIS — M25562 Pain in left knee: Secondary | ICD-10-CM | POA: Diagnosis not present

## 2022-06-28 DIAGNOSIS — M170 Bilateral primary osteoarthritis of knee: Secondary | ICD-10-CM | POA: Diagnosis not present

## 2022-07-12 DIAGNOSIS — M25462 Effusion, left knee: Secondary | ICD-10-CM | POA: Diagnosis not present

## 2022-07-12 DIAGNOSIS — M25562 Pain in left knee: Secondary | ICD-10-CM | POA: Diagnosis not present

## 2022-07-12 DIAGNOSIS — M170 Bilateral primary osteoarthritis of knee: Secondary | ICD-10-CM | POA: Diagnosis not present

## 2022-07-12 DIAGNOSIS — M25561 Pain in right knee: Secondary | ICD-10-CM | POA: Diagnosis not present

## 2022-07-26 DIAGNOSIS — M170 Bilateral primary osteoarthritis of knee: Secondary | ICD-10-CM | POA: Diagnosis not present

## 2022-07-26 DIAGNOSIS — M25562 Pain in left knee: Secondary | ICD-10-CM | POA: Diagnosis not present

## 2022-08-03 ENCOUNTER — Encounter: Payer: Self-pay | Admitting: Internal Medicine

## 2022-08-03 ENCOUNTER — Ambulatory Visit (INDEPENDENT_AMBULATORY_CARE_PROVIDER_SITE_OTHER): Payer: Medicare Other | Admitting: Internal Medicine

## 2022-08-03 VITALS — BP 112/62 | HR 59 | Ht 71.0 in | Wt 243.4 lb

## 2022-08-03 DIAGNOSIS — M170 Bilateral primary osteoarthritis of knee: Secondary | ICD-10-CM

## 2022-08-03 DIAGNOSIS — Z23 Encounter for immunization: Secondary | ICD-10-CM | POA: Diagnosis not present

## 2022-08-03 DIAGNOSIS — J4530 Mild persistent asthma, uncomplicated: Secondary | ICD-10-CM

## 2022-08-03 DIAGNOSIS — K219 Gastro-esophageal reflux disease without esophagitis: Secondary | ICD-10-CM | POA: Diagnosis not present

## 2022-08-03 DIAGNOSIS — Z125 Encounter for screening for malignant neoplasm of prostate: Secondary | ICD-10-CM | POA: Diagnosis not present

## 2022-08-03 DIAGNOSIS — D126 Benign neoplasm of colon, unspecified: Secondary | ICD-10-CM | POA: Diagnosis not present

## 2022-08-03 DIAGNOSIS — E782 Mixed hyperlipidemia: Secondary | ICD-10-CM

## 2022-08-03 DIAGNOSIS — L299 Pruritus, unspecified: Secondary | ICD-10-CM | POA: Diagnosis not present

## 2022-08-03 DIAGNOSIS — R7303 Prediabetes: Secondary | ICD-10-CM

## 2022-08-03 DIAGNOSIS — Z0000 Encounter for general adult medical examination without abnormal findings: Secondary | ICD-10-CM | POA: Diagnosis not present

## 2022-08-03 MED ORDER — ESOMEPRAZOLE MAGNESIUM 40 MG PO CPDR: DELAYED_RELEASE_CAPSULE | ORAL | 3 refills | Status: DC

## 2022-08-03 NOTE — Progress Notes (Signed)
Date:  08/03/2022   Name:  Derek Grant   DOB:  August 14, 1956   MRN:  [id number]   Chief Complaint: Annual Exam Derek Grant is a 66 y.o. male who presents today for his Complete Annual Exam. He feels well. He reports exercising. He reports he is sleeping well.   Colonoscopy: 08/2020 repeat 7 yrs  Immunization History  Administered Date(s) Administered   Fluad Quad(high Dose 68+) 07/30/2020, 08/01/2021   Influenza,inj,Quad PF,6+ Mos 08/09/2016, 08/20/2017   Influenza-Unspecified 07/23/2014, 09/28/2019   PFIZER(Purple Top)SARS-COV-2 Vaccination 12/05/2019, 12/30/2019, 10/05/2020   Pneumococcal Conjugate-13 05/12/2020   Pneumococcal Polysaccharide-23 08/20/2017   Tdap 06/24/2009, 06/09/2016   Health Maintenance Due  Topic Date Due   INFLUENZA VACCINE  05/23/2022   Pneumonia Vaccine 92+ Years old (3 - PPSV23 or PCV20) 08/20/2022    Lab Results  Component Value Date   PSA1 2.9 08/01/2021   PSA1 3.0 07/30/2020   PSA1 3.0 06/02/2019    Gastroesophageal Reflux He complains of heartburn and wheezing. He reports no abdominal pain, no chest pain or no choking. This is a recurrent problem. The problem occurs occasionally. Pertinent negatives include no fatigue. He has tried a PPI for the symptoms. The treatment provided significant relief.  Asthma He complains of frequent throat clearing, shortness of breath and wheezing. Primary symptoms comments: Working with Pulmonary Dr. Sherene Sires for better control. This is a chronic problem. Associated symptoms include heartburn. Pertinent negatives include no appetite change, chest pain, headaches, myalgias or trouble swallowing. His past medical history is significant for asthma.  Skin issues - cyst on back of neck/head.  Itching on upper arm, lesion on left lower leg.  Seen by Jonetta Osgood but not any benefit and cyst was not completely removed. Knee OA - has had injections recently - know having more pain in left knee.  Lab Results   Component Value Date   NA 140 08/01/2021   K 4.8 08/01/2021   CO2 22 08/01/2021   GLUCOSE 99 08/01/2021   BUN 14 08/01/2021   CREATININE 1.13 08/01/2021   CALCIUM 9.1 08/01/2021   EGFR 72 08/01/2021   GFRNONAA 66 07/30/2020   Lab Results  Component Value Date   CHOL 158 08/01/2021   HDL 51 08/01/2021   LDLCALC 66 08/01/2021   TRIG 253 (H) 08/01/2021   CHOLHDL 3.1 08/01/2021   No results found for: "TSH" Lab Results  Component Value Date   HGBA1C 5.8 (H) 08/01/2021   Lab Results  Component Value Date   WBC 4.5 05/18/2022   HGB 13.3 05/18/2022   HCT 40.9 05/18/2022   MCV 86.3 05/18/2022   PLT 266 05/18/2022   Lab Results  Component Value Date   ALT 17 08/01/2021   AST 18 08/01/2021   ALKPHOS 59 08/01/2021   BILITOT <0.2 08/01/2021   No results found for: "25OHVITD2", "25OHVITD3", "VD25OH"   Review of Systems  Constitutional:  Negative for appetite change, chills, diaphoresis, fatigue and unexpected weight change.  HENT:  Negative for hearing loss, tinnitus, trouble swallowing and voice change.   Eyes:  Negative for visual disturbance.  Respiratory:  Positive for shortness of breath and wheezing. Negative for choking.   Cardiovascular:  Negative for chest pain, palpitations and leg swelling.  Gastrointestinal:  Positive for heartburn. Negative for abdominal pain, blood in stool, constipation and diarrhea.  Genitourinary:  Negative for difficulty urinating, dysuria and frequency.  Musculoskeletal:  Negative for arthralgias, back pain and myalgias.  Skin:  Negative for color  change and rash.  Neurological:  Negative for dizziness, syncope and headaches.  Hematological:  Negative for adenopathy.  Psychiatric/Behavioral:  Negative for dysphoric mood and sleep disturbance. The patient is not nervous/anxious.     Patient Active Problem List   Diagnosis Date Noted   DOE (dyspnea on exertion) 12/23/2021   Prediabetes 08/02/2021   Mixed hyperlipidemia 08/02/2021    Rectal polyp    BMI 35.0-35.9,adult 08/30/2020   Asthma, well controlled 07/30/2020   Osteoarthritis of knee 11/04/2019   Patellofemoral stress syndrome 11/04/2019   Decreased hearing of both ears 08/09/2016   GERD (gastroesophageal reflux disease) 04/03/2016   Adenomatous colon polyp 03/02/2015   ED (erectile dysfunction) of organic origin 06/24/2014    No Known Allergies  Past Surgical History:  Procedure Laterality Date   COLONOSCOPY  01/2015   adenomatous polyps   COLONOSCOPY WITH PROPOFOL N/A 09/06/2020   Procedure: COLONOSCOPY WITH PROPOFOL;  Surgeon: Lucilla Lame, MD;  Location: [facility];  Service: Endoscopy;  Laterality: N/A;  priority 4   POLYPECTOMY  09/06/2020   Procedure: POLYPECTOMY;  Surgeon: Lucilla Lame, MD;  Location: [facility];  Service: Endoscopy;;    Social History   Tobacco Use   Smoking status: Former    Packs/day: 0.50    Years: 20.00    Total pack years: 10.00    Types: Cigarettes    Quit date: 06/25/1999    Years since quitting: 23.1   Smokeless tobacco: Never  Vaping Use   Vaping Use: Never used  Substance Use Topics   Alcohol use: Yes    Alcohol/week: 2.0 standard drinks of alcohol    Types: 1 Glasses of wine, 1 Cans of beer per week   Drug use: No     Medication list has been reviewed and updated.  Current Meds  Medication Sig   acetaminophen (TYLENOL) 650 MG CR tablet Take 1,300 mg by mouth every 8 (eight) hours as needed for pain.   albuterol (VENTOLIN HFA) 108 (90 Base) MCG/ACT inhaler INHALE 2 PUFFS INTO THE LUNGS EVERY 6 HOURS AS NEEDED FOR WHEEZING OR SHORTNESS OF BREATH   budesonide-formoterol (SYMBICORT) 160-4.5 MCG/ACT inhaler Inhale 2 puffs into the lungs 2 (two) times daily.   ciclopirox (PENLAC) 8 % solution SMARTSIG:sparingly Topical Daily   esomeprazole (NEXIUM) 40 MG capsule TAKE 1 CAPSULE(40 MG) BY MOUTH DAILY   ibuprofen (ADVIL) 200 MG tablet Take 200 mg by mouth every 6 (six) hours as needed. Pt  taking up to 600 mg PRN; pt aware not to take when taking meloxicam   sildenafil (REVATIO) 20 MG tablet Take 3 tablets (60 mg total) by mouth daily as needed.       08/03/2022    8:34 AM 08/01/2021    9:01 AM 07/30/2020    8:19 AM 05/12/2020    2:43 PM  GAD 7 : Generalized Anxiety Score  Nervous, Anxious, on Edge 0 0 0 0  Control/stop worrying 0 0 0 0  Worry too much - different things 0 0 0 0  Trouble relaxing 0 0 0 0  Restless 0 0 0 0  Easily annoyed or irritable 0 0 0 0  Afraid - awful might happen 0 0 0 0  Total GAD 7 Score 0 0 0 0  Anxiety Difficulty Not difficult at all  Not difficult at all Not difficult at all       08/03/2022    8:34 AM 02/08/2022    3:36 PM 08/01/2021    9:01  AM  Depression screen PHQ 2/9  Decreased Interest 0 0 0  Down, Depressed, Hopeless 0 0 0  PHQ - 2 Score 0 0 0  Altered sleeping 0  0  Tired, decreased energy 0  0  Change in appetite 0  0  Feeling bad or failure about yourself  0  0  Trouble concentrating 0  0  Moving slowly or fidgety/restless 0  0  Suicidal thoughts 0  0  PHQ-9 Score 0  0  Difficult doing work/chores Not difficult at all  Not difficult at all    BP Readings from Last 3 Encounters:  08/03/22 112/62  05/18/22 104/60  02/23/22 102/60    Physical Exam Vitals and nursing note reviewed.  Constitutional:      Appearance: Normal appearance. He is well-developed.  HENT:     Head: Normocephalic.     Right Ear: Tympanic membrane, ear canal and external ear normal.     Left Ear: Tympanic membrane, ear canal and external ear normal.     Nose: Nose normal.  Eyes:     Conjunctiva/sclera: Conjunctivae normal.     Pupils: Pupils are equal, round, and reactive to light.  Neck:     Thyroid: No thyromegaly.     Vascular: No carotid bruit.  Cardiovascular:     Rate and Rhythm: Normal rate and regular rhythm.     Heart sounds: Normal heart sounds.  Pulmonary:     Effort: Pulmonary effort is normal.     Breath sounds: Normal  breath sounds. No wheezing.  Chest:  Breasts:    Right: No mass.     Left: No mass.  Abdominal:     General: Bowel sounds are normal.     Palpations: Abdomen is soft.     Tenderness: There is no abdominal tenderness.  Musculoskeletal:        General: Normal range of motion.     Cervical back: Normal range of motion and neck supple.     Left knee: Swelling and effusion present. Tenderness present.     Right lower leg: No edema.     Left lower leg: No edema.  Lymphadenopathy:     Cervical: No cervical adenopathy.  Skin:    General: Skin is warm and dry.     Capillary Refill: Capillary refill takes less than 2 seconds.     Findings: Lesion present.     Comments: Left leg raised firm lesion Excoriated area upper inner left arm  Neurological:     Mental Status: He is alert and oriented to person, place, and time.     Deep Tendon Reflexes: Reflexes are normal and symmetric.  Psychiatric:        Attention and Perception: Attention normal.        Mood and Affect: Mood normal.        Thought Content: Thought content normal.     Wt Readings from Last 3 Encounters:  08/03/22 243 lb 6.4 oz (110.4 kg)  05/18/22 247 lb 6.4 oz (112.2 kg)  02/23/22 251 lb 9.6 oz (114.1 kg)    BP 112/62   Pulse (!) 59   Ht $R'5\' 11"'XY$  (1.803 m)   Wt 243 lb 6.4 oz (110.4 kg)   SpO2 94%   BMI 33.95 kg/m   Assessment and Plan: 1. Annual physical exam Normal exam except for weight but he has lost 10 lbs recently with effort Continue dietary changes. Up to date on screenings and immunizations.  2. Prostate cancer  screening DRE deferred - PSA  3. Gastroesophageal reflux disease without esophagitis Symptoms well controlled on daily PPI No red flag signs such as weight loss, n/v, melena Will continue Nexium. - esomeprazole (NEXIUM) 40 MG capsule; TAKE 1 CAPSULE(40 MG) BY MOUTH DAILY  Dispense: 90 capsule; Refill: 3  4. Well controlled mild persistent asthma Followed by Pulmonary On Symbicort bid  and Albuterol PRN  5. Prediabetes Continue dietary changes and weight loss efforts - Comprehensive metabolic panel - Hemoglobin A1c  6. Mixed hyperlipidemia Check labs and advise - Lipid panel  7. Adenomatous polyp of colon, unspecified part of colon Due for colonoscopy 2029  8. Need for vaccination for pneumococcus - Pneumococcal conjugate vaccine 20-valent  9. Need for immunization against influenza - Flu Vaccine QUAD High Dose(Fluad)  10. Primary osteoarthritis of both knees Seen at [facility] - s/p Synovisc injections (or similar) recently Left knee is more swollen and has not had any benefit Recommend follow up with specialist  11. Pruritus Seen by Jinny Blossom but not satisfied Will refer to [facility] - Ambulatory referral to Dermatology   Partially dictated using Dragon software. Any errors are unintentional.  Halina Maidens, MD [facility]  08/03/2022

## 2022-08-04 LAB — COMPREHENSIVE METABOLIC PANEL
ALT: 20 IU/L (ref 0–44)
AST: 17 IU/L (ref 0–40)
Albumin/Globulin Ratio: 1.8 (ref 1.2–2.2)
Albumin: 4.1 g/dL (ref 3.9–4.9)
Alkaline Phosphatase: 52 IU/L (ref 44–121)
BUN/Creatinine Ratio: 15 (ref 10–24)
BUN: 18 mg/dL (ref 8–27)
Bilirubin Total: 0.3 mg/dL (ref 0.0–1.2)
CO2: 26 mmol/L (ref 20–29)
Calcium: 9.9 mg/dL (ref 8.6–10.2)
Chloride: 99 mmol/L (ref 96–106)
Creatinine, Ser: 1.18 mg/dL (ref 0.76–1.27)
Globulin, Total: 2.3 g/dL (ref 1.5–4.5)
Glucose: 89 mg/dL (ref 70–99)
Potassium: 4.8 mmol/L (ref 3.5–5.2)
Sodium: 139 mmol/L (ref 134–144)
Total Protein: 6.4 g/dL (ref 6.0–8.5)
eGFR: 68 mL/min/{1.73_m2} (ref >59)

## 2022-08-04 LAB — LIPID PANEL
Chol/HDL Ratio: 2.8 ratio (ref 0.0–5.0)
Cholesterol, Total: 153 mg/dL (ref 100–199)
HDL: 55 mg/dL (ref >39)
LDL Chol Calc (NIH): 87 mg/dL (ref 0–99)
Triglycerides: 55 mg/dL (ref 0–149)
VLDL Cholesterol Cal: 11 mg/dL (ref 5–40)

## 2022-08-04 LAB — PSA: Prostate Specific Ag, Serum: 3.6 ng/mL (ref 0.0–4.0)

## 2022-08-04 LAB — HEMOGLOBIN A1C
Est. average glucose Bld gHb Est-mCnc: 123 mg/dL
Hgb A1c MFr Bld: 5.9 % — ABNORMAL HIGH (ref 4.8–5.6)

## 2022-08-22 DIAGNOSIS — L249 Irritant contact dermatitis, unspecified cause: Secondary | ICD-10-CM | POA: Diagnosis not present

## 2022-08-22 DIAGNOSIS — L281 Prurigo nodularis: Secondary | ICD-10-CM | POA: Diagnosis not present

## 2022-08-22 DIAGNOSIS — L821 Other seborrheic keratosis: Secondary | ICD-10-CM | POA: Diagnosis not present

## 2022-08-22 DIAGNOSIS — B351 Tinea unguium: Secondary | ICD-10-CM | POA: Diagnosis not present

## 2022-08-22 DIAGNOSIS — L298 Other pruritus: Secondary | ICD-10-CM | POA: Diagnosis not present

## 2022-08-23 DIAGNOSIS — M25561 Pain in right knee: Secondary | ICD-10-CM | POA: Diagnosis not present

## 2022-08-23 DIAGNOSIS — M25562 Pain in left knee: Secondary | ICD-10-CM | POA: Diagnosis not present

## 2022-08-23 DIAGNOSIS — M170 Bilateral primary osteoarthritis of knee: Secondary | ICD-10-CM | POA: Diagnosis not present

## 2022-09-13 ENCOUNTER — Other Ambulatory Visit: Payer: Self-pay

## 2022-09-13 ENCOUNTER — Telehealth: Payer: Self-pay | Admitting: Internal Medicine

## 2022-09-13 DIAGNOSIS — Z0000 Encounter for general adult medical examination without abnormal findings: Secondary | ICD-10-CM

## 2022-09-13 NOTE — Telephone Encounter (Signed)
Copied from [place] [id number]. Topic: Referral - Request for Referral >> Sep 13, 2022 12:13 PM Chapman Fitch wrote: Has patient seen PCP for this complaint? No  *If NO, is insurance requiring patient see PCP for this issue before PCP can refer them? Referral for which specialty: Dentistry  Preferred provider/office: [facility] [address] in [place]  Reason for referral: pts dentist needs a referral from pcp to see pt before the end of year

## 2022-11-01 DIAGNOSIS — M25562 Pain in left knee: Secondary | ICD-10-CM | POA: Diagnosis not present

## 2022-11-01 DIAGNOSIS — M170 Bilateral primary osteoarthritis of knee: Secondary | ICD-10-CM | POA: Diagnosis not present

## 2022-11-01 DIAGNOSIS — R262 Difficulty in walking, not elsewhere classified: Secondary | ICD-10-CM | POA: Diagnosis not present

## 2022-11-01 DIAGNOSIS — M25561 Pain in right knee: Secondary | ICD-10-CM | POA: Diagnosis not present

## 2022-11-09 DIAGNOSIS — M25562 Pain in left knee: Secondary | ICD-10-CM | POA: Diagnosis not present

## 2022-11-09 DIAGNOSIS — R262 Difficulty in walking, not elsewhere classified: Secondary | ICD-10-CM | POA: Diagnosis not present

## 2022-11-09 DIAGNOSIS — M25561 Pain in right knee: Secondary | ICD-10-CM | POA: Diagnosis not present

## 2022-11-09 DIAGNOSIS — M170 Bilateral primary osteoarthritis of knee: Secondary | ICD-10-CM | POA: Diagnosis not present

## 2022-11-23 DIAGNOSIS — M25562 Pain in left knee: Secondary | ICD-10-CM | POA: Diagnosis not present

## 2022-11-23 DIAGNOSIS — M25561 Pain in right knee: Secondary | ICD-10-CM | POA: Diagnosis not present

## 2022-11-23 DIAGNOSIS — R262 Difficulty in walking, not elsewhere classified: Secondary | ICD-10-CM | POA: Diagnosis not present

## 2022-11-23 DIAGNOSIS — M170 Bilateral primary osteoarthritis of knee: Secondary | ICD-10-CM | POA: Diagnosis not present

## 2022-11-29 DIAGNOSIS — R262 Difficulty in walking, not elsewhere classified: Secondary | ICD-10-CM | POA: Diagnosis not present

## 2022-11-29 DIAGNOSIS — M25562 Pain in left knee: Secondary | ICD-10-CM | POA: Diagnosis not present

## 2022-11-29 DIAGNOSIS — M25561 Pain in right knee: Secondary | ICD-10-CM | POA: Diagnosis not present

## 2022-11-29 DIAGNOSIS — M170 Bilateral primary osteoarthritis of knee: Secondary | ICD-10-CM | POA: Diagnosis not present

## 2022-12-05 DIAGNOSIS — J45909 Unspecified asthma, uncomplicated: Secondary | ICD-10-CM | POA: Diagnosis not present

## 2022-12-05 DIAGNOSIS — K219 Gastro-esophageal reflux disease without esophagitis: Secondary | ICD-10-CM | POA: Diagnosis not present

## 2022-12-05 DIAGNOSIS — Z6834 Body mass index (BMI) 34.0-34.9, adult: Secondary | ICD-10-CM | POA: Diagnosis not present

## 2022-12-05 DIAGNOSIS — Z008 Encounter for other general examination: Secondary | ICD-10-CM | POA: Diagnosis not present

## 2022-12-05 DIAGNOSIS — M1990 Unspecified osteoarthritis, unspecified site: Secondary | ICD-10-CM | POA: Diagnosis not present

## 2022-12-05 DIAGNOSIS — R030 Elevated blood-pressure reading, without diagnosis of hypertension: Secondary | ICD-10-CM | POA: Diagnosis not present

## 2022-12-05 DIAGNOSIS — Z833 Family history of diabetes mellitus: Secondary | ICD-10-CM | POA: Diagnosis not present

## 2022-12-05 DIAGNOSIS — B351 Tinea unguium: Secondary | ICD-10-CM | POA: Diagnosis not present

## 2022-12-05 DIAGNOSIS — Z8249 Family history of ischemic heart disease and other diseases of the circulatory system: Secondary | ICD-10-CM | POA: Diagnosis not present

## 2022-12-05 DIAGNOSIS — E669 Obesity, unspecified: Secondary | ICD-10-CM | POA: Diagnosis not present

## 2022-12-05 DIAGNOSIS — Z87891 Personal history of nicotine dependence: Secondary | ICD-10-CM | POA: Diagnosis not present

## 2022-12-05 DIAGNOSIS — Z7951 Long term (current) use of inhaled steroids: Secondary | ICD-10-CM | POA: Diagnosis not present

## 2022-12-25 ENCOUNTER — Other Ambulatory Visit: Payer: Self-pay

## 2022-12-25 MED ORDER — BUDESONIDE-FORMOTEROL FUMARATE 160-4.5 MCG/ACT IN AERO: 2.0000 | INHALATION_SPRAY | Freq: Two times a day (BID) | RESPIRATORY_TRACT | 6 refills | Status: DC

## 2023-01-03 DIAGNOSIS — R262 Difficulty in walking, not elsewhere classified: Secondary | ICD-10-CM | POA: Diagnosis not present

## 2023-01-03 DIAGNOSIS — M1712 Unilateral primary osteoarthritis, left knee: Secondary | ICD-10-CM | POA: Diagnosis not present

## 2023-01-03 DIAGNOSIS — M25661 Stiffness of right knee, not elsewhere classified: Secondary | ICD-10-CM | POA: Diagnosis not present

## 2023-01-03 DIAGNOSIS — M25561 Pain in right knee: Secondary | ICD-10-CM | POA: Diagnosis not present

## 2023-01-04 IMAGING — CR DG CHEST 2V
2 series · 2 of 2 positions shown · non-contrast
Comparison: None.

CLINICAL DATA: Dyspnea on exertion

EXAM:
CHEST - 2 VIEW

[chest pa]
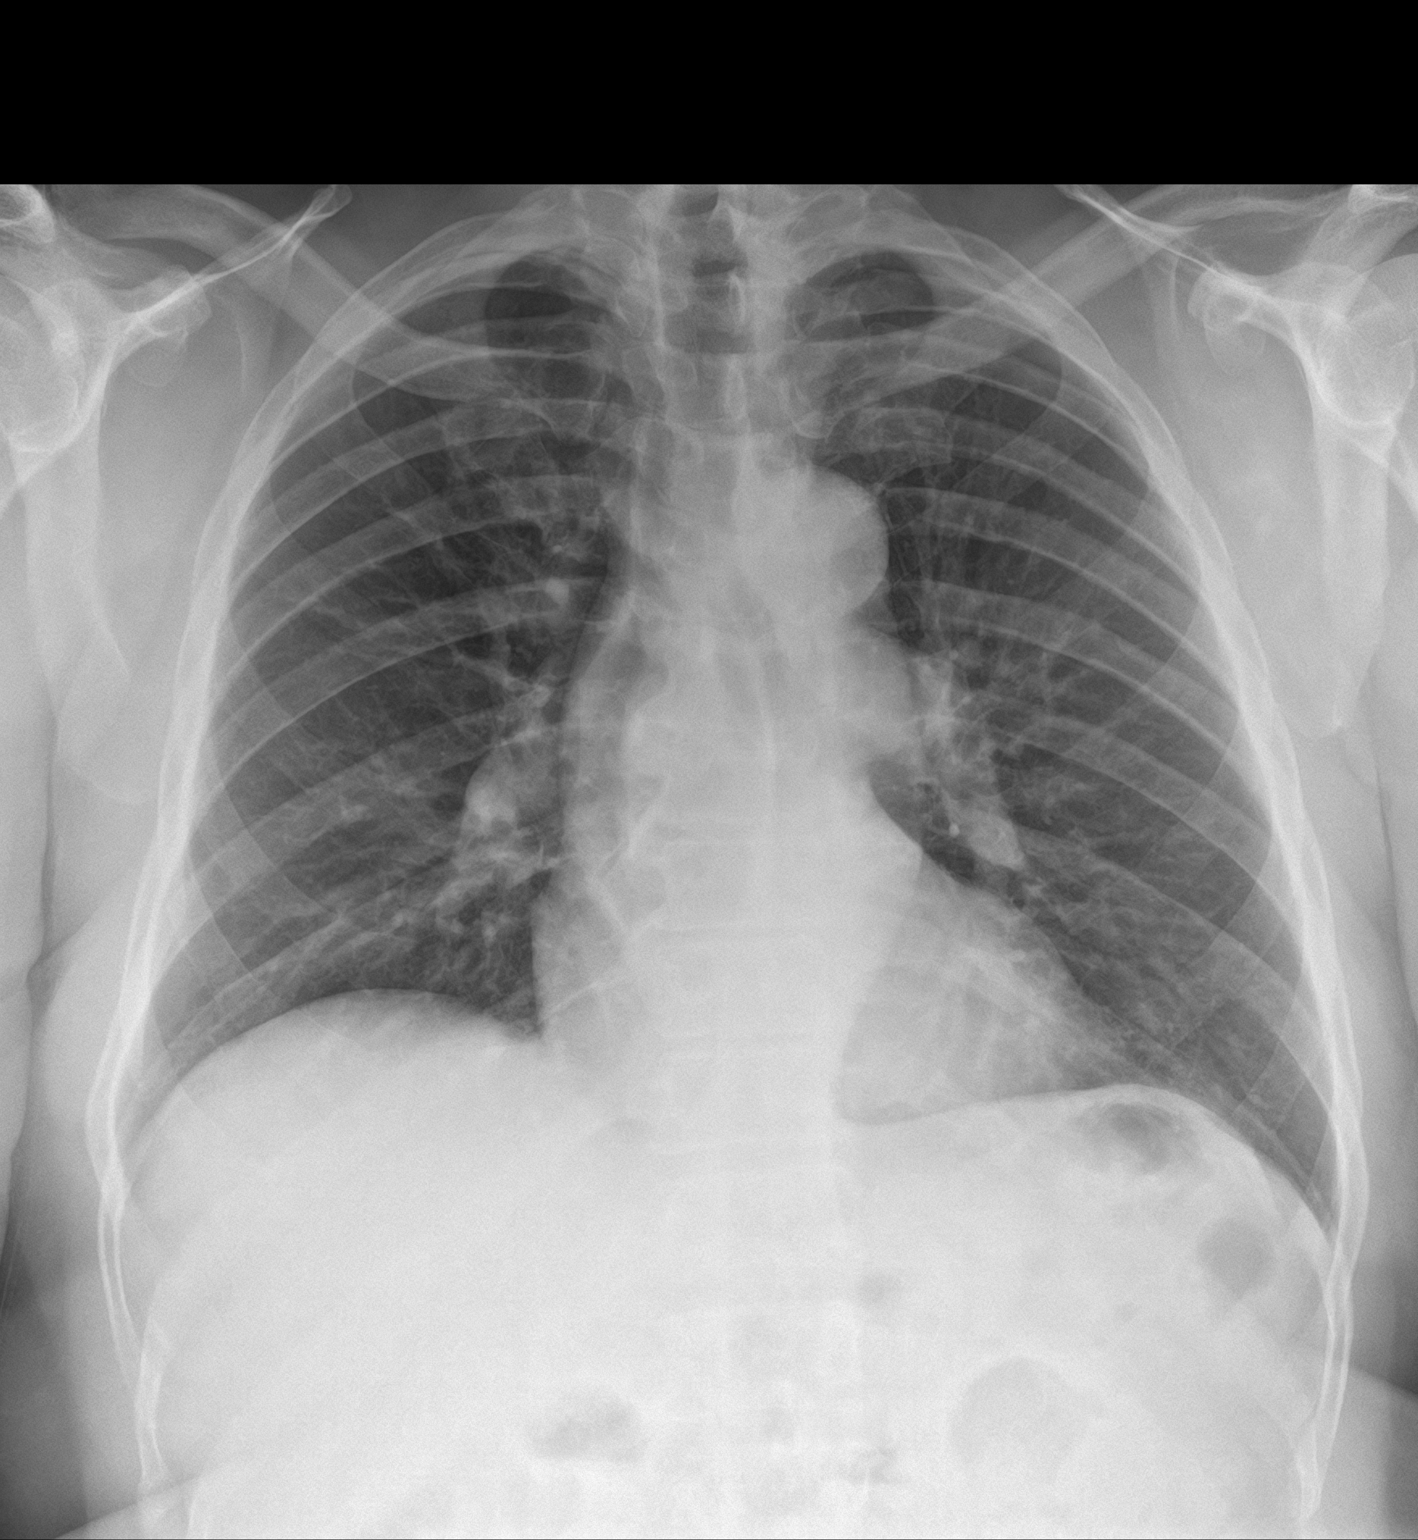

[chest lat]
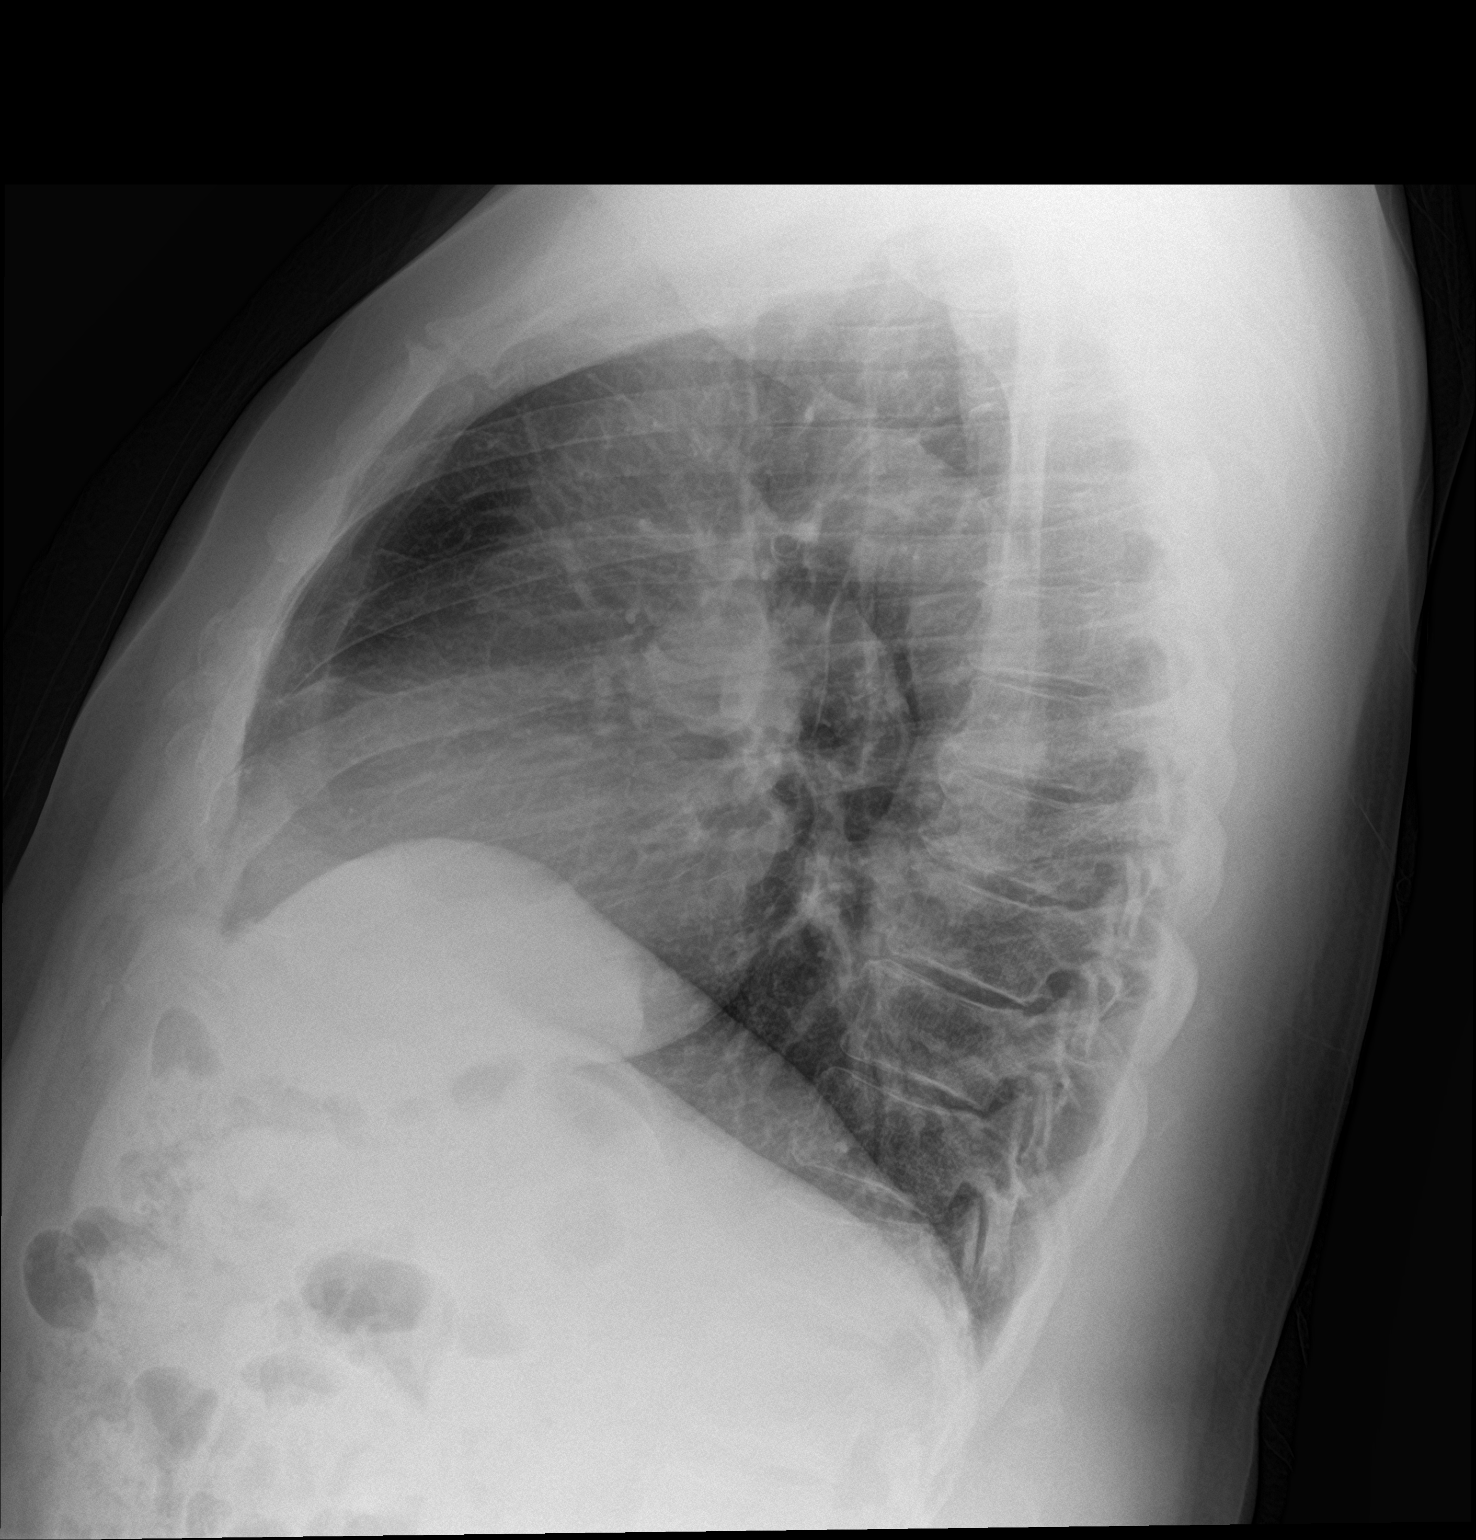

[2 of 2 positions shown; findings below may reference images not displayed]

FINDINGS: The heart size and mediastinal contours are within normal limits.
Both lungs are clear. The visualized skeletal structures are
unremarkable.
IMPRESSION: No active cardiopulmonary disease.

## 2023-01-12 ENCOUNTER — Other Ambulatory Visit (HOSPITAL_COMMUNITY): Payer: Self-pay

## 2023-01-19 ENCOUNTER — Telehealth: Payer: Self-pay

## 2023-01-19 NOTE — Telephone Encounter (Signed)
Patient Advocate Encounter  Prior Authorization for Symbicort has been approved.    Effective dates: 10/23/22 through 10/23/23

## 2023-02-08 NOTE — Progress Notes (Signed)
Derek Grant, male    DOB: [birth year]   MRN: [id number]   Brief patient profile:  66  yobm  quit smoking 2000 = 180-190 wt  referred to pulmonary clinic in [place]  12/22/2021 by Dr Derek Grant  for asthma onset of symptoms his late 60s  > eval by sev doctors one was specialist in [place] > rec BREO some better but not back to usual self.    History of Present Illness  12/22/2021  Pulmonary/ 1st office eval/ Derek Grant / [facility]  Chief Complaint  Patient presents with   pulmonary consult    Per Dr. Judithann Grant- hx of asthma. C/o occ wheezing.   Dyspnea:  MMRC1 = can walk nl pace, flat grade, can't hurry or go uphills or steps s sob   Cough: none  Sleep: able to lie 2 pillow SABA use: never uses, can't tell when stops Breo  Rec Nexium 40 mg Take 30-60 min before first meal of the day  GERD diet reviewed, bed blocks rec  For the next two weeks, try blowing BREO out thru nose, then try for one week every other day then every 3rd day  Only use your albuterol as a rescue medication Ok to try albuterol 15 min before an activity (on alternating days)  that you know would usually make you short of breath PFT's in about 6 weeks. Please schedule a follow up visit in 3 months but call sooner if needed - bring both inhalers     02/23/2022  f/u ov/Derek Grant/ [facility] re: AB  maint on symbicort 160  2bid but not sure he needs it Chief Complaint  Patient presents with   Follow-up    No concerns.  Dyspnea:  no problem flat surface walks anywhere  Cough: none   Sleeping: flat bed / on side  occ noct  wheeze at occ but doesn't wake him  up as long as stays off back  SABA use: none  02: none  Covid status:   3 vax  Rec Continue Nexium 40 mg Take 30-60 min before first meal of the day and add pepcid (famotidine) after supper  GERD  Symbicort 160 up to 1-2 puffs every 12 hours  - if any trouble with breathing coughing or wheezing or need for your albuterol then resume symbicort 160 Take 2 puffs  first thing in am and then another 2 puffs about 12 hours later.   Work on inhaler technique:     Please schedule a follow up visit in 3 months but call sooner if needed bring your inhalers    05/18/2022  f/u ov/Derek Grant/ [facility] re: asthma vs vcd  maint on symbicort 2bid  empty device x m Chief Complaint  Patient presents with   Follow-up    Pepcid is not working.  He was told one of his inhalers was recalled.  Wheezes at night when he has been in a cold trailer at work all day.  Dyspnea: Not limited by breathing from desired activities   Cough: still some harsh cough/ w Sleeping: bed flat/ some pillows  SABA use: none  02: none On ppi in am, not ac, and eats large meal near he then "wheezes due to A/C Rec Symbicort 160 1-2  puffs 1st thing in am and 12 hours later Work on inhaler technique Nexium 40 mg Take 30-60 min before first decent meal of the day  GERD diet reviewed, bed blocks rec   Please schedule a follow up  visit in 3 months but call sooner if needed - bring inhaler   Allergy screen 05/18/2022 >  Eos 0.3 /  IgE  264 > ok to refer to allergy prn   02/09/2023  f/u ov/Derek Grant/ [facility] re: asthma/vcd   maint on symbicort 160 1-2 bid did not bring inhalers   Chief Complaint  Patient presents with   Follow-up    Breathing is good. No SOB or cough. Little wheezing.    Dyspnea:  yardwork is fine  Cough: none since back from [facility] / slt nasal congestion and watery rhinitis  Sleeping: flat bed/ 2 pillows  SABA use: none   No obvious day to day or daytime variability or assoc excess/ purulent sputum or mucus plugs or hemoptysis or cp or chest tightness, subjective wheeze or overt  hb symptoms.   Sleeping  without nocturnal  or early am exacerbation  of respiratory  c/o's or need for noct saba. Also denies any obvious fluctuation of symptoms with weather or environmental changes or other aggravating or alleviating factors except as outlined above   No unusual  exposure hx or h/o childhood pna/ asthma or knowledge of premature birth.  Current Allergies, Complete Past Medical History, Past Surgical History, Family History, and Social History were reviewed in [employer] record.  ROS  The following are not active complaints unless bolded Hoarseness, sore throat, dysphagia, dental problems, itching, sneezing,  nasal congestion or discharge of excess mucus or purulent secretions, ear ache,   fever, chills, sweats, unintended wt loss or wt gain, classically pleuritic or exertional cp,  orthopnea pnd or arm/hand swelling  or leg swelling, presyncope, palpitations, abdominal pain, anorexia, nausea, vomiting, diarrhea  or change in bowel habits or change in bladder habits, change in stools or change in urine, dysuria, hematuria,  rash, arthralgias, visual complaints, headache, numbness, weakness or ataxia or problems with walking or coordination,  change in mood or  memory.        Current Meds  Medication Sig   acetaminophen (TYLENOL) 650 MG CR tablet Take 1,300 mg by mouth every 8 (eight) hours as needed for pain.   albuterol (VENTOLIN HFA) 108 (90 Base) MCG/ACT inhaler INHALE 2 PUFFS INTO THE LUNGS EVERY 6 HOURS AS NEEDED FOR WHEEZING OR SHORTNESS OF BREATH   budesonide-formoterol (SYMBICORT) 160-4.5 MCG/ACT inhaler Inhale 2 puffs into the lungs 2 (two) times daily.   ciclopirox (PENLAC) 8 % solution SMARTSIG:sparingly Topical Daily   esomeprazole (NEXIUM) 40 MG capsule TAKE 1 CAPSULE(40 MG) BY MOUTH DAILY   ibuprofen (ADVIL) 200 MG tablet Take 200 mg by mouth every 6 (six) hours as needed. Pt taking up to 600 mg PRN; pt aware not to take when taking meloxicam   sildenafil (REVATIO) 20 MG tablet Take 3 tablets (60 mg total) by mouth daily as needed.           Objective:     Wts  02/09/2023       254  05/18/2022       247    02/23/22 251 lb 9.6 oz (114.1 kg)  12/22/21 251 lb 12.8 oz (114.2 kg)  08/01/21 260 lb (117.9 kg)       Vital signs reviewed  02/09/2023  - Note at rest 02 sats  95% on RA   General appearance:    robust amb bm / nasal tone to voice    HEENT : Oropharynx  clear     Nasal turbinates mild non-specific edema/no polyps  NECK :  without  apparent JVD/ palpable Nodes/TM    LUNGS: no acc muscle use,  Nl contour chest which is clear to A and P bilaterally without cough on insp or exp maneuvers   CV:  RRR  no s3 or murmur or increase in P2, and no edema   ABD:  soft and nontender with nl inspiratory excursion in the supine position. No bruits or organomegaly appreciated   MS:  Nl gait/ ext warm without deformities Or obvious joint restrictions  calf tenderness, cyanosis or clubbing    SKIN: warm and dry without lesions    NEURO:  alert, approp, nl sensorium with  no motor or cerebellar deficits apparent.       Assessment

## 2023-02-09 ENCOUNTER — Ambulatory Visit: Payer: Medicare Other | Admitting: Internal Medicine

## 2023-02-09 ENCOUNTER — Other Ambulatory Visit: Payer: Self-pay | Admitting: Internal Medicine

## 2023-02-09 ENCOUNTER — Encounter: Payer: Self-pay | Admitting: Internal Medicine

## 2023-02-09 ENCOUNTER — Ambulatory Visit: Payer: Medicare HMO | Admitting: Internal Medicine

## 2023-02-09 VITALS — BP 118/78 | HR 50 | Temp 97.9°F | Ht 71.0 in | Wt 254.2 lb

## 2023-02-09 DIAGNOSIS — J4530 Mild persistent asthma, uncomplicated: Secondary | ICD-10-CM | POA: Diagnosis not present

## 2023-02-09 DIAGNOSIS — K219 Gastro-esophageal reflux disease without esophagitis: Secondary | ICD-10-CM

## 2023-02-09 DIAGNOSIS — N529 Male erectile dysfunction, unspecified: Secondary | ICD-10-CM

## 2023-02-09 MED ORDER — MONTELUKAST SODIUM 10 MG PO TABS: 10.0000 mg | ORAL_TABLET | Freq: Every day | ORAL | 11 refills | Status: DC

## 2023-02-09 NOTE — Assessment & Plan Note (Signed)
Stopped smoking 2000 at wt 180-190   - PFT's  12/23/2021   FEV1 3.34 (108 % ) ratio 0.82  p 21 % improvement from saba p Breo prior to study with DLCO  24.32 (89%)  With a classic VCD pattern f/v loop  - ? Better on Breo ? > try max gerd rx and wean off as of 12/22/2021 @ wt 251  Due to pseudowheeze on exam  and see what if any symptoms flares and if so try change to symbicort 160 2bid > improved 02/23/2022 x for noct "wheeze" on back> max gerd rx then ? Taper to 80 bid prn  .05/18/2022  After extensive coaching inhaler device,  effectiveness =   75% - Allergy screen 05/18/2022 >  Eos 0.3 /  IgE  264> ok to refer to allergy prn - 02/09/2023 added singulair and prn zyrtec   All goals of chronic asthma control met including optimal function and elimination of symptoms with minimal need for rescue therapy.  Contingencies discussed in full including contacting this office immediately if not controlling the symptoms using the rule of two's.     Rhinitis not well controlled and he has atopic screen so rec add singulair and prn zyrtec and f/u with  PCP re referral to local allergist  F/u here can be q 12 m, sooner prn          Each maintenance medication was reviewed in detail including emphasizing most importantly the difference between maintenance and prns and under what circumstances the prns are to be triggered using an action plan format where appropriate.  Total time for H and P, chart review, counseling, reviewing hfa device(s) and generating customized AVS unique to this office visit / same day charting = 30 min

## 2023-02-09 NOTE — Telephone Encounter (Signed)
Medication Refill - Medication: generic Nexium and generic Viagra  Has the patient contacted their pharmacy? Yes.  [facility]  (Agent: If no, request that the patient contact the pharmacy for the refill. If patient does not wish to contact the pharmacy document the reason why and proceed with request.) (Agent: If yes, when and what did the pharmacy advise?)  Preferred Pharmacy (with phone number or street name): [facility] [place]   Has the patient been seen for an appointment in the last year OR does the patient have an upcoming appointment? Yes.    Agent: Please be advised that RX refills may take up to 3 business days. We ask that you follow-up with your pharmacy.

## 2023-02-09 NOTE — Telephone Encounter (Signed)
Medication Refill - Medication: budesonide-formoterol (SYMBICORT) 160-4.5 MCG/ACT inhaler [[id number]]   esomeprazole (NEXIUM) 40 MG capsule [[id number]]  Has the patient contacted their pharmacy? Yes.   (Agent: If no, request that the patient contact the pharmacy for the refill. If patient does not wish to contact the pharmacy document the reason why and proceed with request.) (Agent: If yes, when and what did the pharmacy advise?)  Preferred Pharmacy (with phone number or street name):  [id number] [place], [place] - [address] Phone: [phone]  Fax: [phone]     Has the patient been seen for an appointment in the last year OR does the patient have an upcoming appointment? Yes.    Agent: Please be advised that RX refills may take up to 3 business days. We ask that you follow-up with your pharmacy.

## 2023-02-09 NOTE — Telephone Encounter (Signed)
Requested medication (s) are due for refill today: yes  Requested medication (s) are on the active medication list: yes  Last refill:  12/25/22  Future visit scheduled: yes  Notes to clinic:  Unable to refill per protocol, last refill by another provider. Routing for review.     Requested Prescriptions  Pending Prescriptions Disp Refills   budesonide-formoterol (SYMBICORT) 160-4.5 MCG/ACT inhaler 1 each 6    Sig: Inhale 2 puffs into the lungs 2 (two) times daily.     Pulmonology:  Combination Products Passed - 02/09/2023  1:52 PM      Passed - Valid encounter within last 12 months    Recent Outpatient Visits           6 months ago Annual physical exam   [place] [facility], Nyoka Cowden, MD   1 year ago Annual physical exam   [facility] at [facility], Nyoka Cowden, MD   2 years ago Annual physical exam   [facility] at [facility], Nyoka Cowden, MD   2 years ago Need for vaccination for pneumococcus   [facility] at [facility], Nyoka Cowden, MD   3 years ago Annual physical exam   [facility] at [facility], Nyoka Cowden, MD       Future Appointments             In 2 months Willeen Niece, MD [facility]             esomeprazole (NEXIUM) 40 MG capsule 90 capsule 3    Sig: TAKE 1 CAPSULE(40 MG) BY MOUTH DAILY     Gastroenterology: Proton Pump Inhibitors 2 Passed - 02/09/2023  1:52 PM      Passed - ALT in normal range and within 360 days    ALT  Date Value Ref Range Status  08/03/2022 20 0 - 44 IU/L Final         Passed - AST in normal range and within 360 days    AST  Date Value Ref Range Status  08/03/2022 17 0 - 40 IU/L Final         Passed - Valid encounter within last 12 months    Recent Outpatient Visits           6 months  ago Annual physical exam   [place] [facility] at [facility], Nyoka Cowden, MD   1 year ago Annual physical exam   [facility] at [facility], Nyoka Cowden, MD   2 years ago Annual physical exam   [facility] at [facility], Nyoka Cowden, MD   2 years ago Need for vaccination for pneumococcus   [facility] Primary Care & Sports Medicine at [facility], Nyoka Cowden, MD   3 years ago Annual physical exam   [facility] Primary Care & Sports Medicine at [facility], Nyoka Cowden, MD       Future Appointments             In 2 months Willeen Niece, MD [facility]

## 2023-02-09 NOTE — Patient Instructions (Addendum)
Symbicort 160 up to 2 every 12 hours   Work on inhaler technique:  relax and gently blow all the way out then take a nice smooth full deep breath back in, triggering the inhaler at same time you start breathing in.  Hold breath in for at least  5 seconds if you can. Blow out symbicort  thru nose. Rinse and gargle with water when done.  If mouth or throat bother you at all,  try brushing teeth/gums/tongue with arm and hammer toothpaste/ make a slurry and gargle and spit out.   Singulair 10 mg one daily   For nasal allergies (itching sneezing runny nose ) ok Try zyrtec each pm as needed (clariton or allegra or other options   Please schedule a follow up visit in 12  months but call sooner if needed    .

## 2023-02-12 MED ORDER — SILDENAFIL CITRATE 20 MG PO TABS: 60.0000 mg | ORAL_TABLET | Freq: Every day | ORAL | 0 refills | Status: AC | PRN

## 2023-02-12 MED ORDER — ESOMEPRAZOLE MAGNESIUM 40 MG PO CPDR: DELAYED_RELEASE_CAPSULE | ORAL | 1 refills | Status: DC

## 2023-02-12 NOTE — Telephone Encounter (Signed)
Requested Prescriptions  Pending Prescriptions Disp Refills   esomeprazole (NEXIUM) 40 MG capsule 90 capsule 1    Sig: TAKE 1 CAPSULE(40 MG) BY MOUTH DAILY     Gastroenterology: Proton Pump Inhibitors 2 Passed - 02/09/2023  5:38 PM      Passed - ALT in normal range and within 360 days    ALT  Date Value Ref Range Status  08/03/2022 20 0 - 44 IU/L Final         Passed - AST in normal range and within 360 days    AST  Date Value Ref Range Status  08/03/2022 17 0 - 40 IU/L Final         Passed - Valid encounter within last 12 months    Recent Outpatient Visits           6 months ago Annual physical exam   [place] [facility] at [facility], Nyoka Cowden, MD   1 year ago Annual physical exam   [facility] & Sports Medicine at [facility], Nyoka Cowden, MD   2 years ago Annual physical exam   [facility] at [facility], Nyoka Cowden, MD   2 years ago Need for vaccination for pneumococcus   [facility] Medicine at [facility], Nyoka Cowden, MD   3 years ago Annual physical exam   [facility] Primary Care & Sports Medicine at [facility], Nyoka Cowden, MD       Future Appointments             In 2 months Willeen Niece, MD [facility]             sildenafil (REVATIO) 20 MG tablet 50 tablet 0    Sig: Take 3 tablets (60 mg total) by mouth daily as needed.     Urology: Erectile Dysfunction Agents Passed - 02/09/2023  5:38 PM      Passed - AST in normal range and within 360 days    AST  Date Value Ref Range Status  08/03/2022 17 0 - 40 IU/L Final         Passed - ALT in normal range and within 360 days    ALT  Date Value Ref Range Status  08/03/2022 20 0 - 44 IU/L Final         Passed - Last BP in normal range    BP Readings from Last 1 Encounters:  02/09/23 118/78         Passed - Valid encounter within last  12 months    Recent Outpatient Visits           6 months ago Annual physical exam   [place] [facility] at [facility], Nyoka Cowden, MD   1 year ago Annual physical exam   [facility] & [facility] Medicine at [facility], Nyoka Cowden, MD   2 years ago Annual physical exam   [facility] & Sports Medicine at [facility], Nyoka Cowden, MD   2 years ago Need for vaccination for pneumococcus   [facility] at [facility], Nyoka Cowden, MD   3 years ago Annual physical exam   [facility] & [facility] at [facility], Nyoka Cowden, MD       Future  Appointments             In 2 months Willeen Niece, MD [facility]

## 2023-02-14 ENCOUNTER — Ambulatory Visit (INDEPENDENT_AMBULATORY_CARE_PROVIDER_SITE_OTHER): Payer: Medicare HMO

## 2023-02-14 VITALS — Ht 71.0 in | Wt 254.0 lb

## 2023-02-14 DIAGNOSIS — Z0000 Encounter for general adult medical examination without abnormal findings: Secondary | ICD-10-CM | POA: Diagnosis not present

## 2023-02-14 DIAGNOSIS — Z599 Problem related to housing and economic circumstances, unspecified: Secondary | ICD-10-CM | POA: Diagnosis not present

## 2023-02-14 NOTE — Progress Notes (Signed)
I connected with  Derek Grant on 02/14/23 by a audio enabled telemedicine application and verified that I am speaking with the correct person using two identifiers.  Patient Location: Home  Provider Location: Office/Clinic  I discussed the limitations of evaluation and management by telemedicine. The patient expressed understanding and agreed to proceed.  Subjective:   Derek Grant is a 67 y.o. male who presents for Medicare Annual/Subsequent preventive examination.  Review of Systems     Cardiac Risk Factors include: advanced age (>71men, >74 women);male gender;obesity (BMI >30kg/m2)     Objective:    Today's Vitals   02/14/23 1500  PainSc: 5    There is no height or weight on file to calculate BMI.     02/14/2023    3:07 PM 02/08/2022    3:36 PM 01/24/2021   10:21 AM 09/06/2020    8:38 AM 08/09/2016   10:49 AM 06/27/2016   12:36 PM 06/09/2016   11:44 AM  Advanced Directives  Does Patient Have a Medical Advance Directive? No Yes No No No No No  Type of Furniture conservator/restorer;Living will       Copy of Healthcare Power of Attorney in Chart?  No - copy requested       Would patient like information on creating a medical advance directive? No - Patient declined  Yes (MAU/Ambulatory/Procedural Areas - Information given) Yes (MAU/Ambulatory/Procedural Areas - Information given)  No - patient declined information No - patient declined information    Current Medications (verified) Outpatient Encounter Medications as of 02/14/2023  Medication Sig   acetaminophen (TYLENOL) 650 MG CR tablet Take 1,300 mg by mouth every 8 (eight) hours as needed for pain.   albuterol (VENTOLIN HFA) 108 (90 Base) MCG/ACT inhaler INHALE 2 PUFFS INTO THE LUNGS EVERY 6 HOURS AS NEEDED FOR WHEEZING OR SHORTNESS OF BREATH   budesonide-formoterol (SYMBICORT) 160-4.5 MCG/ACT inhaler Inhale 2 puffs into the lungs 2 (two) times daily.   ciclopirox (PENLAC) 8 % solution  SMARTSIG:sparingly Topical Daily   esomeprazole (NEXIUM) 40 MG capsule TAKE 1 CAPSULE(40 MG) BY MOUTH DAILY   ibuprofen (ADVIL) 200 MG tablet Take 200 mg by mouth every 6 (six) hours as needed. Pt taking up to 600 mg PRN; pt aware not to take when taking meloxicam   montelukast (SINGULAIR) 10 MG tablet Take 1 tablet (10 mg total) by mouth at bedtime.   sildenafil (REVATIO) 20 MG tablet Take 3 tablets (60 mg total) by mouth daily as needed.   No facility-administered encounter medications on file as of 02/14/2023.    Allergies (verified) Patient has no known allergies.   History: Past Medical History:  Diagnosis Date   Arthritis    Asthma    GERD (gastroesophageal reflux disease)    Past Surgical History:  Procedure Laterality Date   COLONOSCOPY  01/2015   adenomatous polyps   COLONOSCOPY WITH PROPOFOL N/A 09/06/2020   Procedure: COLONOSCOPY WITH PROPOFOL;  Surgeon: Midge Minium, MD;  Location: [facility];  Service: Endoscopy;  Laterality: N/A;  priority 4   POLYPECTOMY  09/06/2020   Procedure: POLYPECTOMY;  Surgeon: Midge Minium, MD;  Location: [facility];  Service: Endoscopy;;   Family History  Problem Relation Age of Onset   Diabetes Mother    Alzheimer's disease Father    Throat cancer Brother    Prostate cancer Paternal Uncle    Social History   Socioeconomic History   Marital status: Divorced    Spouse  name: Not on file   Number of children: 2   Years of education: Not on file   Highest education level: Not on file  Occupational History   Not on file  Tobacco Use   Smoking status: Former    Packs/day: 0.50    Years: 20.00    Additional pack years: 0.00    Total pack years: 10.00    Types: Cigarettes    Quit date: 06/25/1999    Years since quitting: 23.6   Smokeless tobacco: Never  Vaping Use   Vaping Use: Never used  Substance and Sexual Activity   Alcohol use: Yes    Alcohol/week: 2.0 standard drinks of alcohol    Types: 1 Glasses of  wine, 1 Cans of beer per week   Drug use: No   Sexual activity: Yes  Other Topics Concern   Not on file  Social History Narrative   Employed training dogs for field trials at [employer]   Works on a farm bailing hay as well   Social Determinants of Health   Financial Resource Strain: Low Risk  (02/14/2023)   Overall Financial Resource Strain (CARDIA)    Difficulty of Paying Living Expenses: Not very hard  Food Insecurity: No Food Insecurity (02/14/2023)   Hunger Vital Sign    Worried About Running Out of Food in the Last Year: Never true    Ran Out of Food in the Last Year: Never true  Transportation Needs: No Transportation Needs (02/14/2023)   PRAPARE - Administrator, Civil Service (Medical): No    Lack of Transportation (Non-Medical): No  Physical Activity: Sufficiently Active (02/14/2023)   Exercise Vital Sign    Days of Exercise per Week: 7 days    Minutes of Exercise per Session: 60 min  Stress: No Stress Concern Present (02/14/2023)   [employer] of Occupational Health - Occupational Stress Questionnaire    Feeling of Stress : Not at all  Social Connections: Moderately Isolated (02/14/2023)   Social Connection and Isolation Panel [NHANES]    Frequency of Communication with Friends and Family: More than three times a week    Frequency of Social Gatherings with Friends and Family: More than three times a week    Attends Religious Services: 1 to 4 times per year    Active Member of [employer] or Organizations: No    Attends Engineer, structural: Never    Marital Status: Divorced    Tobacco Counseling Counseling given: Not Answered   Clinical Intake:  Pre-visit preparation completed: Yes  Pain : 0-10 Pain Score: 5  Pain Type: Chronic pain Pain Location: Knee Pain Orientation: Right, Left     Nutritional Risks: None Diabetes: No  How often do you need to have someone help you when you read instructions,  pamphlets, or other written materials from your doctor or pharmacy?: 1 - Never  Diabetic?no  Interpreter Needed?: No  Information entered by :: Kennedy Bucker, LPN   Activities of Daily Living    02/14/2023    3:08 PM 02/13/2023    9:08 PM  In your present state of health, do you have any difficulty performing the following activities:  Hearing? 1 1  Vision? 0 0  Difficulty concentrating or making decisions? 0 0  Walking or climbing stairs? 1 1  Comment knees   Dressing or bathing? 0 0  Doing errands, shopping? 0 0  Preparing Food and eating ? N N  Using  the Toilet? N N  In the past six months, have you accidently leaked urine? Y Y  Do you have problems with loss of bowel control? N N  Managing your Medications? N N  Managing your Finances? N N  Housekeeping or managing your Housekeeping? N N    Patient Care Team: Reubin Milan, MD as PCP - General (Internal Medicine) Midge Minium, MD as Consulting Physician (Gastroenterology) Deeann Saint, MD (Orthopedic Surgery) Nyoka Cowden, MD as Consulting Physician (Pulmonary Disease) Jesusita Oka, MD as Referring Physician (Dermatology)  Indicate any recent Medical Services you may have received from other than [facility] providers in the past year (date may be approximate).     Assessment:   This is a routine wellness examination for Derek Grant.  Hearing/Vision screen Hearing Screening - Comments:: Has aids, but seldom wears Vision Screening - Comments:: Driving at night glasses-  [place] [facility]  Dietary issues and exercise activities discussed: Current Exercise Habits: Home exercise routine, Type of exercise: Other - see comments (farm work), Time (Minutes): 60, Frequency (Times/Week): 7, Weekly Exercise (Minutes/Week): 420, Intensity: Moderate   Goals Addressed             This Visit's Progress    DIET - EAT MORE FRUITS AND VEGETABLES         Depression Screen    02/14/2023    3:05 PM 08/03/2022    8:34 AM  02/08/2022    3:36 PM 08/01/2021    9:01 AM 01/24/2021   10:20 AM 07/30/2020    8:19 AM 05/12/2020    2:43 PM  PHQ 2/9 Scores  PHQ - 2 Score 0 0 0 0 0 0 0  PHQ- 9 Score 0 0  0  0 0    Fall Risk    02/14/2023    3:08 PM 02/13/2023    9:08 PM 08/03/2022    8:34 AM 02/08/2022    3:37 PM 08/01/2021    9:01 AM  Fall Risk   Falls in the past year? 1 1 0 0 0  Number falls in past yr: 1 1 0 0 0  Injury with Fall? 0 0 0 0 0  Risk for fall due to : History of fall(s)  No Fall Risks No Fall Risks No Fall Risks  Follow up Falls evaluation completed;Falls prevention discussed  Falls evaluation completed Falls prevention discussed Falls evaluation completed    FALL RISK PREVENTION PERTAINING TO THE HOME:  Any stairs in or around the home? Yes  If so, are there any without handrails? No  Home free of loose throw rugs in walkways, pet beds, electrical cords, etc? Yes  Adequate lighting in your home to reduce risk of falls? Yes   ASSISTIVE DEVICES UTILIZED TO PREVENT FALLS:  Life alert? No  Use of a cane, walker or w/c? No  Grab bars in the bathroom? No  Shower chair or bench in shower? No  Elevated toilet seat or a handicapped toilet? No    Cognitive Function:        02/14/2023    3:11 PM  6CIT Screen  What Year? 0 points  What month? 0 points  What time? 0 points  Count back from 20 0 points  Months in reverse 0 points  Repeat phrase 0 points  Total Score 0 points    Immunizations Immunization History  Administered Date(s) Administered   Fluad Quad(high Dose 65+) 07/30/2020, 08/01/2021, 08/03/2022   Influenza,inj,Quad PF,6+ Mos 08/09/2016, 08/20/2017   Influenza-Unspecified 07/23/2014, 09/28/2019  PFIZER(Purple Top)SARS-COV-2 Vaccination 12/05/2019, 12/30/2019, 10/05/2020   PNEUMOCOCCAL CONJUGATE-20 08/03/2022   Pneumococcal Conjugate-13 05/12/2020   Pneumococcal Polysaccharide-23 08/20/2017   Tdap 06/24/2009, 06/09/2016    TDAP status: Up to date  Flu Vaccine  status: Up to date  Pneumococcal vaccine status: Up to date  Covid-19 vaccine status: Completed vaccines  Qualifies for Shingles Vaccine? Yes   Zostavax completed No   Shingrix Completed?: No.    Education has been provided regarding the importance of this vaccine. Patient has been advised to call insurance company to determine out of pocket expense if they have not yet received this vaccine. Advised may also receive vaccine at local pharmacy or Health Dept. Verbalized acceptance and understanding.  Screening Tests Health Maintenance  Topic Date Due   Zoster Vaccines- Shingrix (1 of 2) Never done   COVID-19 Vaccine (4 - 2023-24 season) 06/23/2022   INFLUENZA VACCINE  05/24/2023   Medicare Annual Wellness (AWV)  02/14/2024   DTaP/Tdap/Td (3 - Td or Tdap) 06/09/2026   COLONOSCOPY (Pts 45-75yrs Insurance coverage will need to be confirmed)  09/07/2027   Pneumonia Vaccine 63+ Years old  Completed   Hepatitis C Screening  Addressed   HPV VACCINES  Aged Out    Health Maintenance  Health Maintenance Due  Topic Date Due   Zoster Vaccines- Shingrix (1 of 2) Never done   COVID-19 Vaccine (4 - 2023-24 season) 06/23/2022    Colorectal cancer screening: Type of screening: Colonoscopy. Completed 09/06/20. Repeat every 7 years  Lung Cancer Screening: (Low Dose CT Chest recommended if Age 92-80 years, 30 pack-year currently smoking OR have quit w/in 15years.) does not qualify.   Additional Screening:  Hepatitis C Screening: does qualify; Completed 11/24/14  Vision Screening: Recommended annual ophthalmology exams for early detection of glaucoma and other disorders of the eye. Is the patient up to date with their annual eye exam?  Yes  Who is the provider or what is the name of the office in which the patient attends annual eye exams?  eye If pt is not established with a provider, would they like to be referred to a provider to establish care? No .   Dental Screening: Recommended  annual dental exams for proper oral hygiene  Community Resource Referral / Chronic Care Management: CRR required this visit?  No   CCM required this visit?  No      Plan:     I have personally reviewed and noted the following in the patient's chart:   Medical and social history Use of alcohol, tobacco or illicit drugs  Current medications and supplements including opioid prescriptions. Patient is not currently taking opioid prescriptions. Functional ability and status Nutritional status Physical activity Advanced directives List of other physicians Hospitalizations, surgeries, and ER visits in previous 12 months Vitals Screenings to include cognitive, depression, and falls Referrals and appointments  In addition, I have reviewed and discussed with patient certain preventive protocols, quality metrics, and best practice recommendations. A written personalized care plan for preventive services as well as general preventive health recommendations were provided to patient.     Hal Hope, LPN   [id number]   Nurse Notes: none

## 2023-02-14 NOTE — Patient Instructions (Signed)
Derek Grant , Thank you for taking time to come for your Medicare Wellness Visit. I appreciate your ongoing commitment to your health goals. Please review the following plan we discussed and let me know if I can assist you in the future.   These are the goals we discussed:  Goals      DIET - EAT MORE FRUITS AND VEGETABLES     Patient Stated     Patient states he would like to improve knee pain and mobility.         This is a list of the screening recommended for you and due dates:  Health Maintenance  Topic Date Due   Zoster (Shingles) Vaccine (1 of 2) Never done   COVID-19 Vaccine (4 - 2023-24 season) 06/23/2022   Flu Shot  05/24/2023   Medicare Annual Wellness Visit  02/14/2024   DTaP/Tdap/Td vaccine (3 - Td or Tdap) 06/09/2026   Colon Cancer Screening  09/07/2027   Pneumonia Vaccine  Completed   Hepatitis C Screening: USPSTF Recommendation to screen - Ages 18-79 yo.  Addressed   HPV Vaccine  Aged Out    Advanced directives: no  Conditions/risks identified: none  Next appointment: Follow up in one year for your annual wellness visit. 02/20/24 @ 2:30 pm by phone  Preventive Care 69 Years and Older, Male  Preventive care refers to lifestyle choices and visits with your health care provider that can promote health and wellness. What does preventive care include? A yearly physical exam. This is also called an annual well check. Dental exams once or twice a year. Routine eye exams. Ask your health care provider how often you should have your eyes checked. Personal lifestyle choices, including: Daily care of your teeth and gums. Regular physical activity. Eating a healthy diet. Avoiding tobacco and drug use. Limiting alcohol use. Practicing safe sex. Taking low doses of aspirin every day. Taking vitamin and mineral supplements as recommended by your health care provider. What happens during an annual well check? The services and screenings done by your health care provider  during your annual well check will depend on your age, overall health, lifestyle risk factors, and family history of disease. Counseling  Your health care provider may ask you questions about your: Alcohol use. Tobacco use. Drug use. Emotional well-being. Home and relationship well-being. Sexual activity. Eating habits. History of falls. Memory and ability to understand (cognition). Work and work Astronomer. Screening  You may have the following tests or measurements: Height, weight, and BMI. Blood pressure. Lipid and cholesterol levels. These may be checked every 5 years, or more frequently if you are over 6 years old. Skin check. Lung cancer screening. You may have this screening every year starting at age 53 if you have a 30-pack-year history of smoking and currently smoke or have quit within the past 15 years. Fecal occult blood test (FOBT) of the stool. You may have this test every year starting at age 90. Flexible sigmoidoscopy or colonoscopy. You may have a sigmoidoscopy every 5 years or a colonoscopy every 10 years starting at age 50. Prostate cancer screening. Recommendations will vary depending on your family history and other risks. Hepatitis C blood test. Hepatitis B blood test. Sexually transmitted disease (STD) testing. Diabetes screening. This is done by checking your blood sugar (glucose) after you have not eaten for a while (fasting). You may have this done every 1-3 years. Abdominal aortic aneurysm (AAA) screening. You may need this if you are a current or former  smoker. Osteoporosis. You may be screened starting at age 48 if you are at high risk. Talk with your health care provider about your test results, treatment options, and if necessary, the need for more tests. Vaccines  Your health care provider may recommend certain vaccines, such as: Influenza vaccine. This is recommended every year. Tetanus, diphtheria, and acellular pertussis (Tdap, Td) vaccine. You  may need a Td booster every 10 years. Zoster vaccine. You may need this after age 68. Pneumococcal 13-valent conjugate (PCV13) vaccine. One dose is recommended after age 65. Pneumococcal polysaccharide (PPSV23) vaccine. One dose is recommended after age 60. Talk to your health care provider about which screenings and vaccines you need and how often you need them. This information is not intended to replace advice given to you by your health care provider. Make sure you discuss any questions you have with your health care provider. Document Released: 11/05/2015 Document Revised: 06/28/2016 Document Reviewed: 08/10/2015 Elsevier Interactive Patient Education  2017 [employer].  Fall Prevention in the Home Falls can cause injuries. They can happen to people of all ages. There are many things you can do to make your home safe and to help prevent falls. What can I do on the outside of my home? Regularly fix the edges of walkways and driveways and fix any cracks. Remove anything that might make you trip as you walk through a door, such as a raised step or threshold. Trim any bushes or trees on the path to your home. Use bright outdoor lighting. Clear any walking paths of anything that might make someone trip, such as rocks or tools. Regularly check to see if handrails are loose or broken. Make sure that both sides of any steps have handrails. Any raised decks and porches should have guardrails on the edges. Have any leaves, snow, or ice cleared regularly. Use sand or salt on walking paths during winter. Clean up any spills in your garage right away. This includes oil or grease spills. What can I do in the bathroom? Use night lights. Install grab bars by the toilet and in the tub and shower. Do not use towel bars as grab bars. Use non-skid mats or decals in the tub or shower. If you need to sit down in the shower, use a plastic, non-slip stool. Keep the floor dry. Clean up any water that spills  on the floor as soon as it happens. Remove soap buildup in the tub or shower regularly. Attach bath mats securely with double-sided non-slip rug tape. Do not have throw rugs and other things on the floor that can make you trip. What can I do in the bedroom? Use night lights. Make sure that you have a light by your bed that is easy to reach. Do not use any sheets or blankets that are too big for your bed. They should not hang down onto the floor. Have a firm chair that has side arms. You can use this for support while you get dressed. Do not have throw rugs and other things on the floor that can make you trip. What can I do in the kitchen? Clean up any spills right away. Avoid walking on wet floors. Keep items that you use a lot in easy-to-reach places. If you need to reach something above you, use a strong step stool that has a grab bar. Keep electrical cords out of the way. Do not use floor polish or wax that makes floors slippery. If you must use wax, use non-skid  floor wax. Do not have throw rugs and other things on the floor that can make you trip. What can I do with my stairs? Do not leave any items on the stairs. Make sure that there are handrails on both sides of the stairs and use them. Fix handrails that are broken or loose. Make sure that handrails are as long as the stairways. Check any carpeting to make sure that it is firmly attached to the stairs. Fix any carpet that is loose or worn. Avoid having throw rugs at the top or bottom of the stairs. If you do have throw rugs, attach them to the floor with carpet tape. Make sure that you have a light switch at the top of the stairs and the bottom of the stairs. If you do not have them, ask someone to add them for you. What else can I do to help prevent falls? Wear shoes that: Do not have high heels. Have rubber bottoms. Are comfortable and fit you well. Are closed at the toe. Do not wear sandals. If you use a stepladder: Make  sure that it is fully opened. Do not climb a closed stepladder. Make sure that both sides of the stepladder are locked into place. Ask someone to hold it for you, if possible. Clearly mark and make sure that you can see: Any grab bars or handrails. First and last steps. Where the edge of each step is. Use tools that help you move around (mobility aids) if they are needed. These include: Canes. Walkers. Scooters. Crutches. Turn on the lights when you go into a dark area. Replace any light bulbs as soon as they burn out. Set up your furniture so you have a clear path. Avoid moving your furniture around. If any of your floors are uneven, fix them. If there are any pets around you, be aware of where they are. Review your medicines with your doctor. Some medicines can make you feel dizzy. This can increase your chance of falling. Ask your doctor what other things that you can do to help prevent falls. This information is not intended to replace advice given to you by your health care provider. Make sure you discuss any questions you have with your health care provider. Document Released: 08/05/2009 Document Revised: 03/16/2016 Document Reviewed: 11/13/2014 Elsevier Interactive Patient Education  2017 [employer].

## 2023-02-15 ENCOUNTER — Telehealth: Payer: Self-pay | Admitting: *Deleted

## 2023-02-16 ENCOUNTER — Telehealth: Payer: Self-pay | Admitting: *Deleted

## 2023-02-16 NOTE — Telephone Encounter (Signed)
   Telephone encounter was:  Unsuccessful.  02/16/2023 Name: WHITNEY BINGAMAN MRN: [id number] DOB: 08-28-1956  Unsuccessful outbound call made today to assist with:  Transportation Needs , Food Insecurity, and Financial Difficulties related to [employer] Attempt:  1st Attempt  A HIPAA compliant voice message was left requesting a return call.  Instructed patient to call back at [phone].  Yehuda Mao [facility] -[facility], Population Health [phone] [address] [place] , [place] [address] Email : [email or web address]

## 2023-02-19 ENCOUNTER — Telehealth: Payer: Self-pay | Admitting: *Deleted

## 2023-02-19 NOTE — Telephone Encounter (Signed)
   Telephone encounter was:  Successful.  02/19/2023 Name: Derek Grant MRN: [id number] DOB: [name] is a 67 y.o. year old male who is a primary care patient of Judithann Graves Nyoka Cowden, MD . The community resource team was consulted for assistance with Home Modifications and Financial Difficulties related to utilities   Care guide performed the following interventions: Patient provided with information about care guide support team and interviewed to confirm resource needs. Patient provided information about Extra help and reaching out to dept of insurance as well as information LIEAP and the crisis energy program  Follow Up Plan:  No further follow up planned at this time. The patient has been provided with needed resources. Yehuda Mao [facility], Population Health [phone] [address] [place] , [place] [address] Email : [email or web address]

## 2023-05-02 ENCOUNTER — Ambulatory Visit: Payer: Medicare Other | Admitting: Dermatology

## 2023-05-14 ENCOUNTER — Encounter: Payer: Self-pay | Admitting: Dermatology

## 2023-05-14 ENCOUNTER — Ambulatory Visit: Payer: Medicare HMO | Admitting: Dermatology

## 2023-05-14 VITALS — BP 105/61 | HR 65

## 2023-05-14 DIAGNOSIS — D170 Benign lipomatous neoplasm of skin and subcutaneous tissue of head, face and neck: Secondary | ICD-10-CM | POA: Diagnosis not present

## 2023-05-14 DIAGNOSIS — M1990 Unspecified osteoarthritis, unspecified site: Secondary | ICD-10-CM

## 2023-05-14 DIAGNOSIS — B351 Tinea unguium: Secondary | ICD-10-CM | POA: Diagnosis not present

## 2023-05-14 DIAGNOSIS — L281 Prurigo nodularis: Secondary | ICD-10-CM | POA: Diagnosis not present

## 2023-05-14 MED ORDER — TERBINAFINE HCL 1 % EX CREA: TOPICAL_CREAM | CUTANEOUS | 3 refills | Status: AC

## 2023-05-14 NOTE — Progress Notes (Signed)
New Patient Visit   Subjective  Derek Grant is a 67 y.o. male who presents for the following: Hx of fungus on toenails. Dur: years. Has taken oral antifungal in the past, seemed to help. Has also used ciclopirox solution in the past, no help.  Previous Dr. Cheree Ditto patient.  Check lesion on left lower leg. Used to itch. Raised, thickened. Using a topical cream but does not know the name.  Check scar on left forehead. Secondary to injury with glass when a child. Thinks area is getting larger. Non tender.   The following portions of the chart were reviewed this encounter and updated as appropriate: medications, allergies, medical history  Review of Systems:  No other skin or systemic complaints except as noted in HPI or Assessment and Plan.  Objective  Well appearing patient in no apparent distress; mood and affect are within normal limits.  A focused examination was performed of the following areas: Toenails, left leg, face  Relevant exam findings are noted in the Assessment and Plan.  right thumb Indurated thin plaque overlying right first medial IP joint, at site of prior injury per patient report  Left Lower Leg - Anterior, Neck - Posterior Indurated scaly hyperpigmented plaque on left lower lateral leg Pink indurated scaly papule on left posterior neck  Head - Anterior (Face) 25 x 23 mm Soft compressible rubbery subQ nodule on left forehead underneath linear scar from prior injury  Left 2nd Toenail, Left 3rd Toenail, Left 4th Toenail, Left Hallux Toenail, Right 2nd Toenail, Right 3rd Toenail, Right 4th Toenail, Right Hallux Toenail Greenish discolouration, onychauxis, subungual debris    Assessment & Plan    Arthritis right thumb  Chronic condition, symptomatic with harm to quality of life, not at patient goal Patient reports past injury on right first IP joint and arthritis in right first MCP. Induration overlying IP joint is likely reactive after the injury. No  cutaneous lesions overlying MCP joint. Discussed option of evaluation of orthopedic surgery for possible intervention. Patient agrees with referral.  Ambulatory referral to Orthopedic Surgery - right thumb  Prurigo nodularis Neck - Posterior; Left Lower Leg - Anterior  Chronic condition (outside records show it was present in 2022) with anticipated duration over 1 year, previously pruritic but now asymptomatic, at patient goal  Patient was given a cream by prior dermatologist. It resolved the pruritus associated with the lesions. He does not remember the name. Offered a topical steroid for affected areas; patient prefers to continue using previous medication  Continue current treatment regimen from Dr. Cheree Ditto. Call for refills, and with name of medication, when needed.   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 Not symptomatic/bothersome to patient. at goal.  Lesion is clinically consistent with a lipoma and appears benign. Discussed that biopsy/excision can be extensive with submuscular extension of lesion. Recommend clinical monitoring given that lesion is asymptomatic. Patient will contact us if lesion rapidly grows, becomes painful, bleeds, or affects the nearby left eye. Jointly decided to monitor lesion.  Onychomycosis (8) Left Hallux Toenail; Right Hallux Toenail; Left 2nd Toenail; Right 2nd Toenail; Left 3rd Toenail; Right 3rd Toenail; Left 4th Toenail; Right 4th Toenail  Discussed that nail fungus is common, difficult to treat, frequently recurrent, generally benign. If patient desires aggressive treatment, offered repeat terbinafine course. However, potential side effects include but aren't limited to dysgeusia, cytopenia, GI upset, hepatic injury. Jointly decided to continue topical treatment  only given minimal side effects  Switch from ciclopirox to terbinafine cream daily on all toenails  Related  Medications terbinafine (LAMISIL) 1 % cream Apply every night to affected toenails    Return if symptoms worsen or fail to improve.  I, Lawson Radar, CMA, am acting as scribe for Elie Goody, MD.   Documentation: I have reviewed the above documentation for accuracy and completeness, and I agree with the above.  Elie Goody, MD

## 2023-05-14 NOTE — Patient Instructions (Signed)
Terbinafine cream apply every night to affected toenails.      Due to recent changes in healthcare laws, you may see results of your pathology and/or laboratory studies on MyChart before the doctors have had a chance to review them. We understand that in some cases there may be results that are confusing or concerning to you. Please understand that not all results are received at the same time and often the doctors may need to interpret multiple results in order to provide you with the best plan of care or course of treatment. Therefore, we ask that you please give [place] 2 business days to thoroughly review all your results before contacting the office for clarification. Should we see a critical lab result, you will be contacted sooner.   If You Need Anything After Your Visit  If you have any questions or concerns for your doctor, please call our main line at [phone] and press option 4 to reach your doctor's medical assistant. If no one answers, please leave a voicemail as directed and we will return your call as soon as possible. Messages left after 4 pm will be answered the following business day.   You may also send [place] a message via MyChart. We typically respond to MyChart messages within 1-2 business days.  For prescription refills, please ask your pharmacy to contact our office. Our fax number is [phone].  If you have an urgent issue when the clinic is closed that cannot wait until the next business day, you can page your doctor at the number below.    Please note that while we do our best to be available for urgent issues outside of office hours, we are not available 24/7.   If you have an urgent issue and are unable to reach [place], you may choose to seek medical care at your doctor's office, retail clinic, urgent care center, or emergency room.  If you have a medical emergency, please immediately call 911 or go to the emergency department.  Pager Numbers  - Dr. Gwen Pounds:  [phone]  - Dr. Neale Burly: [phone]  - Dr. Roseanne Reno: [phone]  In the event of inclement weather, please call our main line at [phone] for an update on the status of any delays or closures.  Dermatology Medication Tips: Please keep the boxes that topical medications come in in order to help keep track of the instructions about where and how to use these. Pharmacies typically print the medication instructions only on the boxes and not directly on the medication tubes.   If your medication is too expensive, please contact our office at [phone] option 4 or send [place] a message through MyChart.   We are unable to tell what your co-pay for medications will be in advance as this is different depending on your insurance coverage. However, we may be able to find a substitute medication at lower cost or fill out paperwork to get insurance to cover a needed medication.   If a prior authorization is required to get your medication covered by your insurance company, please allow [place] 1-2 business days to complete this process.  Drug prices often vary depending on where the prescription is filled and some pharmacies may offer cheaper prices.  The website www.goodrx.com contains coupons for medications through different pharmacies. The prices here do not account for what the cost may be with help from insurance (it may be cheaper with your insurance), but the website can give you the price if you did not use any  insurance.  - You can print the associated coupon and take it with your prescription to the pharmacy.  - You may also stop by our office during regular business hours and pick up a GoodRx coupon card.  - If you need your prescription sent electronically to a different pharmacy, notify our office through [facility] or by phone at [phone] option 4.     Si Usted Necesita Algo Despus de Su Visita  Tambin puede enviarnos un mensaje a travs de Clinical cytogeneticist. Por lo general  respondemos a los mensajes de MyChart en el transcurso de 1 a 2 das hbiles.  Para renovar recetas, por favor pida a su farmacia que se ponga en contacto con nuestra oficina. Annie Sable de fax es [place] [phone].  Si tiene un asunto urgente cuando la clnica est cerrada y que no puede esperar hasta el siguiente da hbil, puede llamar/localizar a su doctor(a) al nmero que aparece a continuacin.   Por favor, tenga en cuenta que aunque hacemos todo lo posible para estar disponibles para asuntos urgentes fuera del horario de [place], no estamos disponibles las 24 horas del da, los 7 [address]  [address] de la [place].   Si tiene un problema urgente y no puede comunicarse con nosotros, puede optar por buscar atencin mdica  en el consultorio de su doctor(a), en una clnica privada, en un centro de atencin urgente o en una sala de emergencias.  Si tiene Engineer, drilling, por favor llame inmediatamente al 911 o vaya a la sala de emergencias.  Nmeros de bper  - Dr. Gwen Pounds: [phone]  - Dra. Moye: [phone]  - Dra. Roseanne Reno: [phone]  En caso de inclemencias del [place], por favor llame a Lacy Duverney principal al [phone] para una actualizacin sobre el [place] de cualquier retraso o cierre.  Consejos para la medicacin en dermatologa: Por favor, guarde las cajas en las que vienen los medicamentos de uso tpico para ayudarle a seguir las instrucciones sobre dnde y cmo usarlos. Las farmacias generalmente imprimen las instrucciones del medicamento slo en las cajas y no directamente en los tubos del [place].   Si su medicamento es muy caro, por favor, pngase en contacto con Rolm Gala llamando al [phone] y presione la opcin 4 o envenos un mensaje a travs de Clinical cytogeneticist.   No podemos decirle cul ser su copago por los medicamentos por adelantado ya que esto es diferente dependiendo de la cobertura de su seguro. Sin embargo, es posible que podamos encontrar un  medicamento sustituto a Audiological scientist un formulario para que el seguro cubra el medicamento que se considera necesario.   Si se requiere una autorizacin previa para que su compaa de seguros [place] su medicamento, por favor permtanos de 1 a 2 das hbiles para completar [address]  Los precios de los medicamentos varan con frecuencia dependiendo del Environmental consultant de dnde se surte la receta y alguna farmacias pueden ofrecer precios ms baratos.  El sitio web www.goodrx.com tiene cupones para medicamentos de Health and safety inspector. Los precios aqu no tienen en cuenta lo que podra costar con la ayuda del seguro (puede ser ms barato con su seguro), pero el sitio web puede darle el precio si no utiliz Tourist information centre manager.  - Puede imprimir el cupn correspondiente y llevarlo con su receta a la farmacia.  - Tambin puede pasar por nuestra oficina durante el horario de atencin regular y Education officer, museum una tarjeta de cupones de GoodRx.  - Si necesita que su receta se enve electrnicamente a Neomia Dear  farmacia diferente, informe a nuestra oficina a travs de MyChart de [place] o por telfono llamando al [phone] y presione la opcin 4.

## 2023-05-21 ENCOUNTER — Ambulatory Visit: Payer: Medicare Other | Admitting: Dermatology

## 2023-11-06 ENCOUNTER — Other Ambulatory Visit: Payer: Self-pay | Admitting: Internal Medicine

## 2023-11-06 DIAGNOSIS — K219 Gastro-esophageal reflux disease without esophagitis: Secondary | ICD-10-CM

## 2023-11-07 NOTE — Telephone Encounter (Signed)
 Requested Prescriptions  Pending Prescriptions Disp Refills   esomeprazole  (NEXIUM ) 40 MG capsule [Pharmacy Med Name: ESOMEPRAZOLE  MAG DR 40 MG CAP] 90 capsule 1    Sig: TAKE 1 CAPSULE(40 MG) BY MOUTH DAILY     Gastroenterology: Proton Pump Inhibitors 2 Failed - 11/07/2023 11:33 AM      Failed - ALT in normal range and within 360 days    ALT  Date Value Ref Range Status  08/03/2022 20 0 - 44 IU/L Final         Failed - AST in normal range and within 360 days    AST  Date Value Ref Range Status  08/03/2022 17 0 - 40 IU/L Final         Passed - Valid encounter within last 12 months    Recent Outpatient Visits           1 year ago Annual physical exam    Primary Care & [facility] Medicine at [facility], [place], MD   2 years ago Annual physical exam   [facility] at [facility], [place], MD   3 years ago Annual physical exam   [facility] at [facility], [place], MD   3 years ago Need for vaccination for pneumococcus   [facility] at [facility], [place], MD   4 years ago Annual physical exam   [facility] at [facility], [place]ales [place], MD       Future Appointments             In 3 months [name], [place], MD [facility] Primary Care & [facility] Medicine at [facility], [facility]

## 2023-12-31 ENCOUNTER — Other Ambulatory Visit: Payer: Self-pay | Admitting: Internal Medicine

## 2024-02-13 ENCOUNTER — Encounter: Payer: Self-pay | Admitting: Internal Medicine

## 2024-02-14 DIAGNOSIS — E782 Mixed hyperlipidemia: Secondary | ICD-10-CM | POA: Diagnosis not present

## 2024-02-14 DIAGNOSIS — R7309 Other abnormal glucose: Secondary | ICD-10-CM | POA: Diagnosis not present

## 2024-02-14 DIAGNOSIS — J4520 Mild intermittent asthma, uncomplicated: Secondary | ICD-10-CM | POA: Diagnosis not present

## 2024-02-14 DIAGNOSIS — R350 Frequency of micturition: Secondary | ICD-10-CM | POA: Diagnosis not present

## 2024-02-14 DIAGNOSIS — Z1321 Encounter for screening for nutritional disorder: Secondary | ICD-10-CM | POA: Diagnosis not present

## 2024-02-14 DIAGNOSIS — K219 Gastro-esophageal reflux disease without esophagitis: Secondary | ICD-10-CM | POA: Diagnosis not present

## 2024-03-09 NOTE — Progress Notes (Deleted)
 Derek Grant, male    DOB: February 07, 1956   MRN: [id number]   Brief patient profile:  67  yobm  quit smoking 2000 = 180-190 wt  referred to pulmonary clinic in [name]  12/22/2021 by Dr [name]  for asthma onset of symptoms his late 17s  > eval by sev doctors one was specialist in [place] > rec BREO some better but not back to usual self.    History of Present Illness  12/22/2021  Pulmonary/ 1st office eval/ Derek Grant / [name]  Chief Complaint  Patient presents with   pulmonary consult    Per Dr. [name]- hx of asthma. C/o occ wheezing.   Dyspnea:  MMRC1 = can walk nl pace, flat grade, can't hurry or go uphills or steps s sob   Cough: none  Sleep: able to lie 2 pillow SABA use: never uses, can't tell when stops Breo  Rec Nexium  40 mg Take 30-60 min before first meal of the day  GERD diet reviewed, bed blocks rec  For the next two weeks, try blowing BREO out thru nose, then try for one week every other day then every 3rd day  Only use your albuterol  as a rescue medication Ok to try albuterol  15 min before an activity (on alternating days)  that you know would usually make you short of breath PFT's in about 6 weeks. Please schedule a follow up visit in 3 months but call sooner if needed - bring both inhalers     02/23/2022  f/u ov/[name]/ [facility] re: AB  maint on symbicort  160  2bid but not sure he needs it Chief Complaint  Patient presents with   Follow-up    No concerns.  Dyspnea:  no problem flat surface walks anywhere  Cough: none   Sleeping: flat bed / on side  occ noct  wheeze at occ but doesn't wake him  up as long as stays off back  SABA use: none  02: none  Covid status:   3 vax  Rec Continue Nexium  40 mg Take 30-60 min before first meal of the day and add pepcid  (famotidine ) after supper  GERD  Symbicort  160 up to 1-2 puffs every 12 hours  - if any trouble with breathing coughing or wheezing or need for your albuterol  then resume symbicort  160 Take 2 puffs  first thing in am and then another 2 puffs about 12 hours later.   Work on inhaler technique:     Please schedule a follow up visit in 3 months but call sooner if needed bring your inhalers    05/18/2022  f/u ov/Derek Grant/ [facility] re: asthma vs vcd  maint on symbicort  2bid  empty device x m Chief Complaint  Patient presents with   Follow-up    Pepcid  is not working.  He was told one of his inhalers was recalled.  Wheezes at night when he has been in a cold trailer at work all day.  Dyspnea: Not limited by breathing from desired activities   Cough: still some harsh cough/ w Sleeping: bed flat/ some pillows  SABA use: none  02: none On ppi in am, not ac, and eats large meal near he then "wheezes due to A/C Rec Symbicort  160 1-2  puffs 1st thing in am and 12 hours later Work on inhaler technique Nexium  40 mg Take 30-60 min before first decent meal of the day  GERD diet reviewed, bed blocks rec   Please schedule a follow up  visit in 3 months but call sooner if needed - bring inhaler   Allergy screen 05/18/2022 >  Eos 0.3 /  IgE  264 > ok to refer to allergy prn   02/09/2023  f/u ov/Derek Grant/ [facility] re: asthma/vcd   maint on symbicort  160 1-2 bid did not bring inhalers   Chief Complaint  Patient presents with   Follow-up    Breathing is good. No SOB or cough. Little wheezing.    Dyspnea:  yardwork is fine  Cough: none since back from [facility] / slt nasal congestion and watery rhinitis  Sleeping: flat bed/ 2 pillows  SABA use: none  Rec     03/14/2024  f/u ov/[place] office/Derek Grant re: *** maint on ***  No chief complaint on file.   Dyspnea:  *** Cough: *** Sleeping: ***   resp cc  SABA use: *** 02: ***  Lung cancer screening: ***   No obvious day to day or daytime variability or assoc excess/ purulent sputum or mucus plugs or hemoptysis or cp or chest tightness, subjective wheeze or overt sinus or hb symptoms.    Also denies any obvious fluctuation of symptoms  with weather or environmental changes or other aggravating or alleviating factors except as outlined above   No unusual exposure hx or h/o childhood pna/ asthma or knowledge of premature birth.  Current Allergies, Complete Past Medical History, Past Surgical History, Family History, and Social History were reviewed in [employer] record.  ROS  The following are not active complaints unless bolded Hoarseness, sore throat, dysphagia, dental problems, itching, sneezing,  nasal congestion or discharge of excess mucus or purulent secretions, ear ache,   fever, chills, sweats, unintended wt loss or wt gain, classically pleuritic or exertional cp,  orthopnea pnd or arm/hand swelling  or leg swelling, presyncope, palpitations, abdominal pain, anorexia, nausea, vomiting, diarrhea  or change in bowel habits or change in bladder habits, change in stools or change in urine, dysuria, hematuria,  rash, arthralgias, visual complaints, headache, numbness, weakness or ataxia or problems with walking or coordination,  change in mood or  memory.        No outpatient medications have been marked as taking for the 03/14/24 encounter (Appointment) with Derek Lack B, MD.           Objective:     Wts  03/14/2024       ***  02/09/2023       254  05/18/2022       247    02/23/22 251 lb 9.6 oz (114.1 kg)  12/22/21 251 lb 12.8 oz (114.2 kg)  08/01/21 260 lb (117.9 kg)     Vital signs reviewed  03/14/2024  - Note at rest 02 sats  ***% on ***   General appearance:    ***            Assessment

## 2024-03-14 ENCOUNTER — Ambulatory Visit: Admitting: Internal Medicine

## 2024-03-18 DIAGNOSIS — H2513 Age-related nuclear cataract, bilateral: Secondary | ICD-10-CM | POA: Diagnosis not present

## 2024-03-19 ENCOUNTER — Ambulatory Visit: Admitting: Internal Medicine

## 2024-03-19 ENCOUNTER — Encounter: Payer: Self-pay | Admitting: Internal Medicine

## 2024-03-19 DIAGNOSIS — J4520 Mild intermittent asthma, uncomplicated: Secondary | ICD-10-CM

## 2024-03-19 DIAGNOSIS — Z87891 Personal history of nicotine dependence: Secondary | ICD-10-CM | POA: Diagnosis not present

## 2024-03-19 DIAGNOSIS — J45909 Unspecified asthma, uncomplicated: Secondary | ICD-10-CM | POA: Diagnosis not present

## 2024-03-19 MED ORDER — ALBUTEROL SULFATE HFA 108 (90 BASE) MCG/ACT IN AERS: 2.0000 | INHALATION_SPRAY | Freq: Four times a day (QID) | RESPIRATORY_TRACT | 0 refills | Status: AC | PRN

## 2024-03-19 NOTE — Progress Notes (Signed)
 Derek Grant, male    DOB: 1956-10-05   MRN: [id number]   Brief patient profile:  67  yobm  quit smoking 2000 = 180-190 wt  referred to pulmonary clinic in [facility]  12/22/2021 by Dr [name]  for asthma onset of symptoms his late 50s  > eval by sev doctors one was specialist in [place] > rec BREO some better but not back to usual self.    History of Present Illness  12/22/2021  Pulmonary/ 1st office eval/ Derek Grant / [facility]  Chief Complaint  Patient presents with   pulmonary consult    Per Dr. [name]- hx of asthma. C/o occ wheezing.   Dyspnea:  MMRC1 = can walk nl pace, flat grade, can't hurry or go uphills or steps s sob   Cough: none  Sleep: able to lie 2 pillow SABA use: never uses, can't tell when stops Breo  Rec Nexium  40 mg Take 30-60 min before first meal of the day  GERD diet reviewed, bed blocks rec  For the next two weeks, try blowing BREO out thru nose, then try for one week every other day then every 3rd day  Only use your albuterol  as a rescue medication Ok to try albuterol  15 min before an activity (on alternating days)  that you know would usually make you short of breath PFT's in about 6 weeks. Please schedule a follow up visit in 3 months but call sooner if needed - bring both inhalers     02/23/2022  f/u ov/Derek Grant/ [facility] re: AB  maint on symbicort  160  2bid but not sure he needs it Chief Complaint  Patient presents with   Follow-up    No concerns.  Dyspnea:  no problem flat surface walks anywhere  Cough: none   Sleeping: flat bed / on side  occ noct  wheeze at occ but doesn't wake him  up as long as stays off back  SABA use: none  02: none  Covid status:   3 vax  Rec Continue Nexium  40 mg Take 30-60 min before first meal of the day and add pepcid  (famotidine ) after supper  GERD  Symbicort  160 up to 1-2 puffs every 12 hours  - if any trouble with breathing coughing or wheezing or need for your albuterol  then resume symbicort  160 Take 2 puffs  first thing in am and then another 2 puffs about 12 hours later.   Work on inhaler technique:     Please schedule a follow up visit in 3 months but call sooner if needed bring your inhalers    05/18/2022  f/u ov/Derek Grant/ [facility] re: asthma vs vcd  maint on symbicort  2bid  empty device x m Chief Complaint  Patient presents with   Follow-up    Pepcid  is not working.  He was told one of his inhalers was recalled.  Wheezes at night when he has been in a cold trailer at work all day.  Dyspnea: Not limited by breathing from desired activities   Cough: still some harsh cough/ w Sleeping: bed flat/ some pillows  SABA use: none  02: none On ppi in am, not ac, and eats large meal near he then "wheezes due to A/C Rec Symbicort  160 1-2  puffs 1st thing in am and 12 hours later Work on inhaler technique Nexium  40 mg Take 30-60 min before first decent meal of the day  GERD diet reviewed, bed blocks rec   Please schedule a follow up  visit in 3 months but call sooner if needed - bring inhaler   Allergy screen 05/18/2022 >  Eos 0.3 /  IgE  264 > ok to refer to allergy prn   02/09/2023  f/u ov/Derek Grant/ [facility] re: asthma/vcd   maint on symbicort  160 1-2 bid did not bring inhalers   Chief Complaint  Patient presents with   Follow-up    Breathing is good. No SOB or cough. Little wheezing.    Dyspnea:  yardwork is fine  Cough: none since back from [facility] / slt nasal congestion and watery rhinitis  Sleeping: flat bed/ 2 pillows  SABA use: none  Rec Symbicort  160 up to 2 every 12 hours  Work on inhaler technique:   Singulair  10 mg one daily  For nasal allergies (itching sneezing runny nose ) ok Try zyrtec each pm as needed (clariton or allegra or other options  Please schedule a follow up visit in 12  months but call sooner if neede     03/19/2024  f/u ov/ office/Derek Grant re: astham/vcd  maint on symbicort  160 x 2  / stopped singulair  s adverse effects  Chief Complaint  Patient  presents with   Asthma  Dyspnea:  yardwork tol fine  - multiple properties  Cough: none  Sleeping: flat bed/ 2 pillows    resp cc  SABA use: not using  02: none   Lung cancer screening:   No obvious day to day or daytime variability or assoc excess/ purulent sputum or mucus plugs or hemoptysis or cp or chest tightness, subjective wheeze or overt sinus or hb symptoms.    Also denies any obvious fluctuation of symptoms with weather or environmental changes or other aggravating or alleviating factors except as outlined above   No unusual exposure hx or h/o childhood pna/ asthma or knowledge of premature birth.  Current Allergies, Complete Past Medical History, Past Surgical History, Family History, and Social History were reviewed in [employer] record.  ROS  The following are not active complaints unless bolded Hoarseness, sore throat, dysphagia, dental problems, itching, sneezing,  nasal congestion or discharge of excess mucus or purulent secretions, ear ache,   fever, chills, sweats, unintended wt loss or wt gain, classically pleuritic or exertional cp,  orthopnea pnd or arm/hand swelling  or leg swelling, presyncope, palpitations, abdominal pain, anorexia, nausea, vomiting, diarrhea  or change in bowel habits or change in bladder habits, change in stools or change in urine, dysuria, hematuria,  rash, arthralgias, visual complaints, headache, numbness, weakness or ataxia or problems with walking or coordination,  change in mood or  memory.        Current Meds  Medication Sig   acetaminophen  (TYLENOL ) 650 MG CR tablet Take 1,300 mg by mouth every 8 (eight) hours as needed for pain.   albuterol  (VENTOLIN  HFA) 108 (90 Base) MCG/ACT inhaler INHALE 2 PUFFS INTO THE LUNGS EVERY 6 HOURS AS NEEDED FOR WHEEZING OR SHORTNESS OF BREATH   budesonide -formoterol  (SYMBICORT ) 160-4.5 MCG/ACT inhaler INHALE 2 PUFFS INTO LUNGS TWICE DAILY   ciclopirox (PENLAC) 8 % solution  SMARTSIG:sparingly Topical Daily   esomeprazole  (NEXIUM ) 40 MG capsule TAKE 1 CAPSULE(40 MG) BY MOUTH DAILY   ibuprofen (ADVIL) 200 MG tablet Take 200 mg by mouth every 6 (six) hours as needed. Pt taking up to 600 mg PRN; pt aware not to take when taking meloxicam   montelukast  (SINGULAIR ) 10 MG tablet Take 1 tablet (10 mg total) by mouth at bedtime.  sildenafil  (REVATIO ) 20 MG tablet Take 3 tablets (60 mg total) by mouth daily as needed.   terbinafine  (LAMISIL ) 1 % cream Apply every night to affected toenails           Objective:     Wts  03/19/2024       258   02/09/2023       254  05/18/2022       247    02/23/22 251 lb 9.6 oz (114.1 kg)  12/22/21 251 lb 12.8 oz (114.2 kg)  08/01/21 260 lb (117.9 kg)     Vital signs reviewed  03/19/2024  - Note at rest 02 sats  93% on RA   General appearance:    amb pleasant bm nad   HEENT : Oropharynx  clear      Nasal turbinates nl    NECK :  without  apparent JVD/ palpable Nodes/TM    LUNGS: no acc muscle use,  Nl contour chest which is clear to A and P bilaterally without cough on insp or exp maneuvers   CV:  RRR  no s3 or murmur or increase in P2, and no edema   ABD:  soft and nontender   MS:  Gait nl   ext warm without deformities Or obvious joint restrictions  calf tenderness, cyanosis or clubbing    SKIN: warm and dry without lesions    NEURO:  alert, approp, nl sensorium with  no motor or cerebellar deficits apparent.             Assessment

## 2024-03-19 NOTE — Patient Instructions (Addendum)
 No need for singulair   - no other change in your medications.  Work on inhaler technique:  relax and gently blow all the way out then take a nice smooth full deep breath back in, triggering the inhaler at same time you start breathing in.  Hold breath in for at least  5 seconds if you can. Blow out symbicort   thru nose. Rinse and gargle with water  when done.  If mouth or throat bother you at all,  try brushing teeth/gums/tongue with arm and hammer toothpaste/ make a slurry and gargle and spit out.      Please schedule a follow up visit in 12  months but call sooner if needed

## 2024-03-19 NOTE — Assessment & Plan Note (Signed)
 Stopped smoking 2000 at wt 180-190   - PFT's  12/23/2021   FEV1 3.34 (108 % ) ratio 0.82  p 21 % improvement from saba p Breo prior to study with DLCO  24.32 (89%)  With a classic VCD pattern f/v loop  - ? Better on Breo ? > try max gerd rx and wean off as of 12/22/2021 @ wt 251  Due to pseudowheeze on exam  and see what if any symptoms flares and if so try change to symbicort  160 2bid > improved 02/23/2022 x for noct "wheeze" on back> max gerd rx then ? Taper to 80 bid prn  .05/18/2022  After extensive coaching inhaler device,  effectiveness =   75% - Allergy screen 05/18/2022 >  Eos 0.3 /  IgE  264> ok to refer to allergy prn - 02/09/2023 added singulair  and prn zyrtec > no change on / off singulair  so left it off as of 03/19/2024 - 03/19/2024  After extensive coaching inhaler device,  effectiveness =    75% (short Ti with hfa) > continue symbicort  160 up to 2 bid   All goals of chronic asthma control met including optimal function and elimination of symptoms with minimal need for rescue therapy.  Contingencies discussed in full including contacting this office immediately if not controlling the symptoms using the rule of two's.     F/u q year, sooner prn          Each maintenance medication was reviewed in detail including emphasizing most importantly the difference between maintenance and prns and under what circumstances the prns are to be triggered using an action plan format where appropriate.  Total time for H and P, chart review, counseling, reviewing hfa  device(s) and generating customized AVS unique to this office visit / same day charting = 26 min

## 2024-05-13 ENCOUNTER — Ambulatory Visit: Admitting: Internal Medicine

## 2024-05-17 ENCOUNTER — Other Ambulatory Visit: Payer: Self-pay | Admitting: Internal Medicine

## 2024-05-17 DIAGNOSIS — K219 Gastro-esophageal reflux disease without esophagitis: Secondary | ICD-10-CM

## 2024-05-19 NOTE — Telephone Encounter (Signed)
 No longer under providers care Requested Prescriptions  Refused Prescriptions Disp Refills   esomeprazole  (NEXIUM ) 40 MG capsule [Pharmacy Med Name: ESOMEPRAZOLE  MAG DR 40 MG CAP] 90 capsule 1    Sig: TAKE 1 CAPSULE(40 MG) BY MOUTH DAILY     There is no refill protocol information for this order

## 2024-07-28 DIAGNOSIS — Z23 Encounter for immunization: Secondary | ICD-10-CM | POA: Diagnosis not present

## 2024-07-28 DIAGNOSIS — K219 Gastro-esophageal reflux disease without esophagitis: Secondary | ICD-10-CM | POA: Diagnosis not present

## 2024-07-28 DIAGNOSIS — D229 Melanocytic nevi, unspecified: Secondary | ICD-10-CM | POA: Diagnosis not present

## 2024-07-28 DIAGNOSIS — J4520 Mild intermittent asthma, uncomplicated: Secondary | ICD-10-CM | POA: Diagnosis not present

## 2024-08-04 ENCOUNTER — Ambulatory Visit

## 2024-08-28 ENCOUNTER — Ambulatory Visit

## 2024-09-09 ENCOUNTER — Ambulatory Visit: Admitting: Dermatology

## 2024-09-09 ENCOUNTER — Encounter: Payer: Self-pay | Admitting: Dermatology

## 2024-09-09 DIAGNOSIS — D2222 Melanocytic nevi of left ear and external auricular canal: Secondary | ICD-10-CM

## 2024-09-09 DIAGNOSIS — D239 Other benign neoplasm of skin, unspecified: Secondary | ICD-10-CM

## 2024-09-09 DIAGNOSIS — D170 Benign lipomatous neoplasm of skin and subcutaneous tissue of head, face and neck: Secondary | ICD-10-CM

## 2024-09-09 DIAGNOSIS — D235 Other benign neoplasm of skin of trunk: Secondary | ICD-10-CM

## 2024-09-09 DIAGNOSIS — L813 Cafe au lait spots: Secondary | ICD-10-CM | POA: Diagnosis not present

## 2024-09-09 DIAGNOSIS — D229 Melanocytic nevi, unspecified: Secondary | ICD-10-CM

## 2024-09-09 NOTE — Patient Instructions (Signed)

## 2024-09-09 NOTE — Progress Notes (Signed)
   Follow-Up Visit   Subjective  Derek Grant is a 68 y.o. male who presents for the following: Patient presents for places of concern at left ear, back, and forehead none are bothersome to patient.     The following portions of the chart were reviewed this encounter and updated as appropriate: medications, allergies, medical history  Review of Systems:  No other skin or systemic complaints except as noted in HPI or Assessment and Plan.  Objective  Well appearing patient in no apparent distress; mood and affect are within normal limits.  A focused examination was performed of the following areas: Left ear, back   Relevant exam findings are noted in the Assessment and Plan.    Assessment & Plan   MELANOCYTIC NEVI Exam: Tan-brown and/or pink-flesh-colored symmetric macules and papules at left helical insertion  Treatment Plan: Benign appearing on exam today. Recommend observation. Call clinic for new or changing moles. Recommend daily use of broad spectrum spf 30+ sunscreen to sun-exposed areas.   Cafe au Lait  - Tan patch at left flank  - Genetic - Benign, observe - Call for any changes  DERMATOFIBROMA Exam: Firm pink/brown papulenodule with dimple sign at left mid back.  Treatment Plan: A dermatofibroma is a benign growth possibly related to trauma, such as an insect bite, cut from shaving, or inflamed acne-type bump.  Treatment options to remove include shave or excision with resulting scar and risk of recurrence.  Since benign-appearing and not bothersome, will observe for now.   Lipoma  Exam: Subcutaneous rubbery nodule(s) Location: left forehead  Benign-appearing. Exam most consistent with a lipoma. Discussed that a lipoma is a benign fatty growth that can grow over time and sometimes get irritated. Recommend observation if it is not bothersome or changing. Discussed option of ILK injections or surgical excision to remove it if it is growing, symptomatic, or other  changes noted. Please call for new or changing lesions so they can be evaluated.  Advised if patient would like to have removed would refer to Dr. Paci.   MULTIPLE BENIGN NEVI   CAFE AU LAIT SPOTS   DERMATOFIBROMA   LIPOMA OF FACE    Return if symptoms worsen or fail to improve.  I, Jacquelynn V. Wilfred, CMA, am acting as scribe for Boneta Sharps, MD.  Documentation: I have reviewed the above documentation for accuracy and completeness, and I agree with the above.  Boneta Sharps, MD
